# Patient Record
Sex: Female | Born: 1950 | Race: White | Hispanic: No | Marital: Married | Smoking: Never smoker
Health system: Southern US, Community
[De-identification: ages and names within clinical notes are randomized; demographics above are authoritative.]

## PROBLEM LIST (undated history)

## (undated) DIAGNOSIS — R51 Headache: Secondary | ICD-10-CM

## (undated) DIAGNOSIS — B958 Unspecified staphylococcus as the cause of diseases classified elsewhere: Secondary | ICD-10-CM

## (undated) DIAGNOSIS — D649 Anemia, unspecified: Secondary | ICD-10-CM

## (undated) DIAGNOSIS — Z5189 Encounter for other specified aftercare: Secondary | ICD-10-CM

## (undated) DIAGNOSIS — K219 Gastro-esophageal reflux disease without esophagitis: Secondary | ICD-10-CM

## (undated) DIAGNOSIS — R519 Headache, unspecified: Secondary | ICD-10-CM

## (undated) DIAGNOSIS — C50919 Malignant neoplasm of unspecified site of unspecified female breast: Secondary | ICD-10-CM

## (undated) HISTORY — PX: BREAST LUMPECTOMY WITH AXILLARY LYMPH NODE BIOPSY: SHX5593

## (undated) HISTORY — DX: Encounter for other specified aftercare: Z51.89

## (undated) HISTORY — DX: Malignant neoplasm of unspecified site of unspecified female breast: C50.919

---

## 1975-02-25 DIAGNOSIS — Z5189 Encounter for other specified aftercare: Secondary | ICD-10-CM

## 1975-02-25 HISTORY — DX: Encounter for other specified aftercare: Z51.89

## 1984-02-25 DIAGNOSIS — B958 Unspecified staphylococcus as the cause of diseases classified elsewhere: Secondary | ICD-10-CM

## 1984-02-25 HISTORY — DX: Unspecified staphylococcus as the cause of diseases classified elsewhere: B95.8

## 2007-04-07 ENCOUNTER — Encounter: Payer: Self-pay | Admitting: Family Medicine

## 2007-04-07 ENCOUNTER — Ambulatory Visit: Payer: Self-pay | Admitting: Family Medicine

## 2007-04-12 ENCOUNTER — Encounter: Admission: RE | Admit: 2007-04-12 | Discharge: 2007-04-12 | Payer: Self-pay | Admitting: Family Medicine

## 2007-04-13 ENCOUNTER — Ambulatory Visit: Payer: Self-pay | Admitting: Family Medicine

## 2007-04-20 ENCOUNTER — Encounter: Admission: RE | Admit: 2007-04-20 | Discharge: 2007-04-20 | Payer: Self-pay | Admitting: Family Medicine

## 2009-11-06 ENCOUNTER — Ambulatory Visit: Payer: Self-pay | Admitting: Obstetrics & Gynecology

## 2009-11-06 LAB — CONVERTED CEMR LAB
ALT: 16 units/L (ref 0–35)
AST: 18 units/L (ref 0–37)
BUN: 13 mg/dL (ref 6–23)
Calcium: 9.7 mg/dL (ref 8.4–10.5)
Chloride: 102 meq/L (ref 96–112)
Creatinine, Ser: 0.71 mg/dL (ref 0.40–1.20)
HDL: 53 mg/dL (ref >39)
TSH: 1.992 microintl units/mL (ref 0.350–4.500)
Total Bilirubin: 0.9 mg/dL (ref 0.3–1.2)
Total CHOL/HDL Ratio: 4.3
VLDL: 20 mg/dL (ref 0–40)

## 2009-12-12 ENCOUNTER — Ambulatory Visit (HOSPITAL_COMMUNITY): Admission: RE | Admit: 2009-12-12 | Discharge: 2009-12-12 | Payer: Self-pay | Admitting: Family Medicine

## 2010-04-03 ENCOUNTER — Encounter: Payer: Self-pay | Admitting: Family Medicine

## 2010-04-03 ENCOUNTER — Ambulatory Visit (INDEPENDENT_AMBULATORY_CARE_PROVIDER_SITE_OTHER): Payer: BC Managed Care – PPO | Admitting: Family Medicine

## 2010-04-03 DIAGNOSIS — R1013 Epigastric pain: Secondary | ICD-10-CM

## 2010-04-03 DIAGNOSIS — G43009 Migraine without aura, not intractable, without status migrainosus: Secondary | ICD-10-CM | POA: Insufficient documentation

## 2010-04-11 ENCOUNTER — Other Ambulatory Visit: Payer: Self-pay | Admitting: Family Medicine

## 2010-04-11 ENCOUNTER — Other Ambulatory Visit: Payer: BC Managed Care – PPO

## 2010-04-11 ENCOUNTER — Encounter (INDEPENDENT_AMBULATORY_CARE_PROVIDER_SITE_OTHER): Payer: Self-pay | Admitting: *Deleted

## 2010-04-11 DIAGNOSIS — Z1211 Encounter for screening for malignant neoplasm of colon: Secondary | ICD-10-CM

## 2010-04-11 NOTE — Letter (Signed)
Summary: [facility]: Immunoassay Fecal Occult Blood (iFOB) Order Form  [place] [facility]  [address] [place], [place] [address]   Phone: [phone]  Fax: [phone]      [facility]: Immunoassay Fecal Occult Blood (iFOB) Order Form   April 03, 2010 MRN: [id number]   [facility] Nov 13, 1950   Physicican Name:_________________________  Diagnosis Code:__________V76.51________________      Eustaquio Boyden  MD

## 2010-04-11 NOTE — Assessment & Plan Note (Addendum)
Summary: new pt to est DLO   Vital Signs:  Patient profile:   60 year old female Height:      66 inches Weight:      189.25 pounds BMI:     30.66 Temp:     98.2 degrees F oral Pulse rate:   76 / minute Pulse rhythm:   regular BP sitting:   124 / 80  (left arm) Cuff size:   large  Vitals Entered By: Selena Batten Dance CMA Duncan Dull) (April 03, 2010 9:40 AM) CC: New patient to establish care   History of Present Illness: CC: pew patient, establish  recently moved from [place], [place].    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  calcium -takes total of 1500mg  calcium.  MVI has 500IU vit D.  abd pain - mid epigastric, dull, sometimes notices more after drinking something.  Not really affected by food.  No nausea, fevers.  started when had very stressful job, changed job and got better but never fully went away.  wondered about ulcer.  no indigestion or acid reflux, no dysphagia.  not exhertional.  no weight changes.  no fevers/chills, NS.  preventative: no flu shot, doesn't usually take. unsure tetanus shot. never complete physical. well woman with Dr. Marice Potter, last pap/mammo 10/2009 at [facility] colonsocopy 2006 WNL, but does have strong fm hx  dexa scan done, told WNL  we should be getting records from colonscopy as well as Dexa scan, otherwise received care at [facility]  Current Medications (verified): 1)  Centrum Silver  Tabs (Multiple Vitamins-Minerals) .... One Daily 2)  Calcium-Magnesium-Zinc 1000-400-15 Mg Tabs .Marland Kitchen.. 1 By Mouth Two Times A Day 3)  Ester-C  Tabs (Bioflavonoid Products) .Marland Kitchen.. 1 By Mouth Once Daily  Allergies (verified): 1)  ! * Pseudophedrine  Past History:  Past Medical History: h/o chicken pox h/o migraines h/o blood transfusion 1977  OBGYN - Dr Marice Potter  Past Surgical History: C-section 1977, 1980  Family History: Mother - Colon CA (dec 84) sister - melanoma MGF - Colon CA (dec 66) MGM - heart issues, DM (24s) cousin -  CAD/MI (61yo M)  No other CA, CVA  Social History: No smoking, no EtOH, no rec drugs caffeine: 1 cup tea/day, 2 sodas/day Lives with husband, 1 bird Occupation: Currently [employer] part time, previously Advice worker in education  Review of Systems       The patient complains of abdominal pain.  The patient denies anorexia, fever, weight loss, weight gain, vision loss, decreased hearing, hoarseness, chest pain, syncope, dyspnea on exertion, peripheral edema, prolonged cough, headaches, hemoptysis, melena, hematochezia, severe indigestion/heartburn, hematuria, depression, and breast masses.         no BM changes, n/v/d.  Physical Exam  General:  Well-developed,well-nourished,in no acute distress; alert,appropriate and cooperative throughout examination Head:  Normocephalic and atraumatic without obvious abnormalities. No apparent alopecia or balding. Eyes:  No corneal or conjunctival inflammation noted. EOMI. Perrla.T Ears:  TMs clear bilaterally Nose:  nares clear bilaterally Mouth:  MMM, no pharyngeal erythema, edema, exudates Neck:  No deformities, masses, or tenderness  no LAD Lungs:  Normal respiratory effort, chest expands symmetrically. Lungs are clear to auscultation, no crackles or wheezes. Heart:  Normal rate and regular rhythm. S1 and S2 normal without gallop, murmur, click, rub or other extra sounds. Abdomen:  Bowel sounds positive,abdomen soft and non-tender without masses, organomegaly or hernias noted. Msk:  No deformity or scoliosis noted of thoracic or lumbar  spine.   Pulses:  2+ rad pulses, brisk cap refill Extremities:  no pedal edema Neurologic:  CN grossly intact, station and gait intact   Impression & Recommendations:  Problem # 1:  ABDOMINAL PAIN, EPIGASTRIC (ICD-789.06) Assessment New possible dyspepsia.  trial of PPI.  return in 1 month for f/u.  Problem # 2:  Preventive Health Care (ICD-V70.0) return for CPE.  pt unsure about  tetanus.  strong family history of colon cancer.  had normal colonoscopy per report 2006.  discussed if normal colonscopy, reassuring for 10 years.  given fmhx, will rescreen with iFOB.  await colonoscopy report that pt has requested be sent to [place].  Complete Medication List: 1)  Centrum Silver Tabs (Multiple vitamins-minerals) .... One daily 2)  Calcium-magnesium-zinc 1000-400-15 Mg Tabs  .Marland KitchenMarland Kitchen. 1 by mouth two times a day 3)  Ester-c Tabs (Bioflavonoid products) .Marland Kitchen.. 1 by mouth once daily 4)  Prilosec 20 Mg Cpdr (Omeprazole) .... Take one daily for 2-3 wks  Patient Instructions: 1)  Pleasure to meet you today. 2)  trial of omeprazole 20mg  daily for 2-3 wks. 3)  Return in 1 month for follow up of abdominal discomfort. 4)  If not better, please let me know return to be seen. 5)  I will check to see what blood work has been done recently.  May call you for follow up labs.   Orders Added: 1)  New Patient Level II [99202]    Current Allergies (reviewed today): ! * PSEUDOPHEDRINE  Appended Document: new pt to est DLO previous pcp:  Molly Maduro, [facility] [address] [place] [address]. previous colonosocpy: [facility], [place] [phone])

## 2010-04-12 ENCOUNTER — Encounter (INDEPENDENT_AMBULATORY_CARE_PROVIDER_SITE_OTHER): Payer: Self-pay | Admitting: *Deleted

## 2010-04-17 NOTE — Miscellaneous (Signed)
Summary: iFOB to flowsheet  Clinical Lists Changes  Observations: Added new observation of HEMOCULTDUE: 04/2011 (04/12/2010 8:30) Added new observation of HEMOCCULT: negative (04/11/2010 8:31)      Preventive Care Screening  Hemoccult:    Date:  04/11/2010    Next Due:  04/2011    Results:  negative

## 2010-04-17 NOTE — Letter (Signed)
Summary: Results Follow up Letter  [place] at [facility]  [address] [place], [place] [address]   Phone: [phone]  Fax: [phone]    04/12/2010 MRN: [id number]  [facility] [address] [place], [place]  [address]  [place]  Dear [name],  The following are the results of your recent test(s):  Test         Result    Pap Smear:        Normal _____  Not Normal _____ Comments: ______________________________________________________ Cholesterol: LDL(Bad cholesterol):         Your goal is less than:         HDL (Good cholesterol):       Your goal is more than: Comments:  ______________________________________________________ Mammogram:        Normal _____  Not Normal _____ Comments:  ___________________________________________________________________ Hemoccult:        Normal __X___  Not normal _______ Comments:  Repeat in 1 year  _____________________________________________________________________ Other Tests:    We routinely do not discuss normal results over the telephone.  If you desire a copy of the results, or you have any questions about this information we can discuss them at your next office visit.   Sincerely,       Dr. Eustaquio Boyden

## 2010-05-01 ENCOUNTER — Encounter: Payer: Self-pay | Admitting: Family Medicine

## 2010-05-01 ENCOUNTER — Ambulatory Visit (INDEPENDENT_AMBULATORY_CARE_PROVIDER_SITE_OTHER): Payer: BC Managed Care – PPO | Admitting: Family Medicine

## 2010-05-01 DIAGNOSIS — R1013 Epigastric pain: Secondary | ICD-10-CM

## 2010-05-01 DIAGNOSIS — Z9189 Other specified personal risk factors, not elsewhere classified: Secondary | ICD-10-CM | POA: Insufficient documentation

## 2010-05-07 NOTE — Assessment & Plan Note (Signed)
Summary: FOLLOW UP IN 1 MTH   Vital Signs:  Patient profile:   60 year old female Weight:      191.25 pounds Temp:     98.1 degrees F oral Pulse rate:   84 / minute Pulse rhythm:   regular BP sitting:   122 / 78  (left arm) Cuff size:   large  Vitals Entered By: Selena Batten Dance CMA (AAMA) (May 01, 2010 10:29 AM) CC: 1 month follow up   History of Present Illness: CC: f/u  85mo f/u for abd pain.  seen last month with epigastric discomfort stress induced, thought dyspepsia, treated with 2 wk course of ppi.  first 5 days on omeprazole felt nausea and sleepiness from med (looked up side effects).  still took 2 wk course, no longer feeling symptoms.    No further complaints today.  Allergies: 1)  ! * Pseudophedrine  Past History:  Past Medical History: Last updated: 04/03/2010 h/o chicken pox h/o migraines h/o blood transfusion 1977  OBGYN - Dr Marice Potter  Review of Systems       per HPI  Physical Exam  General:  Well-developed,well-nourished,in no acute distress; alert,appropriate and cooperative throughout examination Abdomen:  Bowel sounds positive,abdomen soft and non-tender without masses, organomegaly or hernias noted. Pulses:  2+ rad pulses, brisk cap refill Extremities:  no pedal edema   Impression & Recommendations:  Problem # 1:  ABDOMINAL PAIN, EPIGASTRIC (ICD-789.06) resolved after 2wk course PPI.  discussed likely dyspepsia, could try tums or zantac in future if returns.  if not resolving, return for eval.  Complete Medication List: 1)  Centrum Silver Tabs (Multiple vitamins-minerals) .... One daily 2)  Calcium-magnesium-zinc 1000-400-15 Mg Tabs  .Marland KitchenMarland Kitchen. 1 by mouth two times a day 3)  Ester-c Tabs (Bioflavonoid products) .Marland Kitchen.. 1 by mouth once daily  Patient Instructions: 1)  I'm glad you're feeling better. 2)  if having symptoms again, try tums or zantac.  if not improved let me know. 3)  call us with questions.  good to see you today.   Orders Added: 1)   Est. Patient Level II [82956]    Current Allergies (reviewed today): ! * PSEUDOPHEDRINE

## 2010-07-09 NOTE — Assessment & Plan Note (Signed)
NAME:  Jenna Pitts, Jenna Pitts               ACCOUNT NO.:  [id number]   MEDICAL RECORD NO.:  [id number]          PATIENT TYPE:  POB   LOCATION:  [facility]         FACILITY:  [facility]   PHYSICIAN:  Tinnie Gens, MD        DATE OF BIRTH:  08/09/1960   DATE OF SERVICE:  04/07/2007                                  CLINIC NOTE   CHIEF COMPLAINT:  Yearly exam.   HISTORY OF PRESENT ILLNESS:  The patient is a 60 year old gravida 2,  para 2 who presents today as a new patient for yearly exam.  The patient  has been menopausal for the past 7 years, no cycles, no abnormal  bleeding, no abnormal discharge.  Her last Pap smear was in 2007 and was  normal.  Last mammogram was February 2008, also normal.  She has a  colonoscopy which was normal March 2007.   PAST MEDICAL HISTORY:  She had pneumonia as a child.  Otherwise, no  medical problems.   SURGICAL HISTORY:  C-section x2.   MEDICATIONS:  1. Multivitamin 1 p.o. daily.  2. Calcium and magnesium and zinc combo 1 p.o. daily.  3. Estra-C 1 p.o. daily.  4. Fish oil 1 p.o. daily.   ALLERGIES:  None known.   OBSTETRICAL HISTORY:  G2 P2, 2 C-sections.   GYN HISTORY:  Menarche at age 44, menopause age 71, currently on no  birth control.   FAMILY HISTORY:  Coronary artery disease in her grandmother and then  colon cancer in her mother and grandfather.   SOCIAL HISTORY:  She is a retired Runner, broadcasting/film/video.  She currently works part-  time at [employer].  She is married.  She has recently moved here from  [place] because her daughter is here.  Her daughter is a Runner, broadcasting/film/video at  [employer], and her son-in-law has Ewing sarcoma and is  undergoing 1 year long chemotherapy for this, and she has a lot of  responsibility in child care for her granddaughter.  There is no  tobacco, alcohol, or drug use.   A 14-point review of systems was reviewed.  See gyn history in the chart  which is negative.   EXAM:  Pulse of 83, blood pressure 130/83, weight 191,  height 5 feet 6.5  inches.  GENERAL:  She is a well-developed, well-nourished female in no acute  distress.  HEENT:  Normocephalic, atraumatic.  Sclerae anicteric.  NECK:  Supple.  Normal thyroid.  LUNGS:  Clear bilaterally.  CV:  Regular rate and rhythm.  No rubs, gallops, or murmurs.  ABDOMEN:  Soft, nontender, nondistended.  No masses.  EXTREMITIES:  No cyanosis, clubbing or edema.  2+ distal pulses.  BREASTS:  Symmetric with everted nipples.  No masses.  No  supraclavicular or axillary adenopathy.  GU:  Normal external female genitalia.  External hemorrhoids are noticed.  BUS was normal.  Vagina is pink.  There is some loss of rugation. The cervix is nulliparous without  lesion.  Uterus is small, anteverted.  No adnexal mass or tenderness   IMPRESSION:  Yearly exam.   PLAN:  1. Pap smear today.  2. Order mammogram.  3. She will need  followup Pap smear in 2-3 years if this one is      normal.  4. Needs yearly mammograms.  5. Check lipid panel, TSH, CBC, and BMP for supportive for yearly      exam.           ______________________________  Tinnie Gens, MD     TP/MEDQ  D:  04/07/2007  T:  04/08/2007  Job:  [id number]

## 2010-07-09 NOTE — Assessment & Plan Note (Signed)
NAMEVOLLIE, AARON               ACCOUNT NO.:  [id number]   MEDICAL RECORD NO.:  [id number]          PATIENT TYPE:  POB   LOCATION:  [facility]         FACILITY:  [facility]   PHYSICIAN:  Allie Bossier, MD        DATE OF BIRTH:  [birth year]   DATE OF SERVICE:  11/06/2009                                  CLINIC NOTE   Ms. Stults is a 60 year old married white gravida 2, para 2 who comes  in here for a new exam.  She has no particular complaints.   PAST MEDICAL HISTORY:  Obesity.   PAST SURGICAL HISTORY:  C-section x2 and a tubal ligation.   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  She has been married for 37 years and rarely has sex secondary  to her husband's ED.  She gives a distant history of a blood transfusion  with her first cesarean section.  Her last Pap smear and mammogram were  both done in 2009 and her last colonoscopy was done in 2007.   Family history is positive for colon cancer in her mother, maternal  grandfather, and maternal uncle.  She has no family history of breast or  GYN cancer.  There is diabetes and coronary artery disease in her  family.  No latex allergies.  No drug allergies.   Social history is negative for tobacco, alcohol or drug use.   MEDICATIONS:  Multivitamin, calcium, magnesium, zinc, Estar and fish oil  daily.   PHYSICAL EXAMINATION:  GENERAL:  Well-nourished, well-hydrated pleasant  white female.  VITAL SIGNS:  Height 5 feet 6-1/2 inches, weight 196 pounds, blood  pressure 134/92, pulse 74.  HEENT:  Normal.  HEART:  Regular rate and rhythm.  LUNGS:  Clear to auscultation bilaterally.  ABDOMEN:  Obese, benign.  PELVIC:  External genitalia normal.  No lesions.  She has some  varicosities on her mons.  Cervix nulliparous.  There is a mild to  moderate amount of vaginal atrophy.  Uterus normal size and shape,  anteverted, mobile.  Adnexa nontender.  No masses.   ASSESSMENT AND PLAN:  Annual  exam.  I have checked Pap smear.  Recommended self-breast and self-vulvar exams.  I scheduled a mammogram.  I am referring her to a gastroenterologist for repeat colonoscopy due to  her family history.  Recommended that we go ahead and check fasting  lipids, TSH and sugar today.      Allie Bossier, MD     MCD/MEDQ  D:  11/06/2009  T:  11/07/2009  Job:  [id number]

## 2012-02-13 ENCOUNTER — Encounter: Payer: Self-pay | Admitting: Obstetrics and Gynecology

## 2012-02-13 ENCOUNTER — Ambulatory Visit (INDEPENDENT_AMBULATORY_CARE_PROVIDER_SITE_OTHER): Payer: BC Managed Care – PPO | Admitting: Obstetrics and Gynecology

## 2012-02-13 VITALS — BP 134/85 | HR 66 | Ht 66.0 in | Wt 191.0 lb

## 2012-02-13 DIAGNOSIS — Z124 Encounter for screening for malignant neoplasm of cervix: Secondary | ICD-10-CM

## 2012-02-13 DIAGNOSIS — Z01419 Encounter for gynecological examination (general) (routine) without abnormal findings: Secondary | ICD-10-CM

## 2012-02-13 NOTE — Patient Instructions (Signed)
Preventive Care for Adults, Female A healthy lifestyle and preventive care can promote health and wellness. Preventive health guidelines for women include the following key practices.  A routine yearly physical is a good way to check with your caregiver about your health and preventive screening. It is a chance to share any concerns and updates on your health, and to receive a thorough exam.  Visit your dentist for a routine exam and preventive care every 6 months. Brush your teeth twice a day and floss once a day. Good oral hygiene prevents tooth decay and gum disease.  The frequency of eye exams is based on your age, health, family medical history, use of contact lenses, and other factors. Follow your caregiver's recommendations for frequency of eye exams.  Eat a healthy diet. Foods like vegetables, fruits, whole grains, low-fat dairy products, and lean protein foods contain the nutrients you need without too many calories. Decrease your intake of foods high in solid fats, added sugars, and salt. Eat the right amount of calories for you.Get information about a proper diet from your caregiver, if necessary.  Regular physical exercise is one of the most important things you can do for your health. Most adults should get at least 150 minutes of moderate-intensity exercise (any activity that increases your heart rate and causes you to sweat) each week. In addition, most adults need muscle-strengthening exercises on 2 or more days a week.  Maintain a healthy weight. The body mass index (BMI) is a screening tool to identify possible weight problems. It provides an estimate of body fat based on height and weight. Your caregiver can help determine your BMI, and can help you achieve or maintain a healthy weight.For adults 20 years and older:  A BMI below 18.5 is considered underweight.  A BMI of 18.5 to 24.9 is normal.  A BMI of 25 to 29.9 is considered overweight.  A BMI of 30 and above is  considered obese.  Maintain normal blood lipids and cholesterol levels by exercising and minimizing your intake of saturated fat. Eat a balanced diet with plenty of fruit and vegetables. Blood tests for lipids and cholesterol should begin at age 20 and be repeated every 5 years. If your lipid or cholesterol levels are high, you are over 50, or you are at high risk for heart disease, you may need your cholesterol levels checked more frequently.Ongoing high lipid and cholesterol levels should be treated with medicines if diet and exercise are not effective.  If you smoke, find out from your caregiver how to quit. If you do not use tobacco, do not start.  If you are pregnant, do not drink alcohol. If you are breastfeeding, be very cautious about drinking alcohol. If you are not pregnant and choose to drink alcohol, do not exceed 1 drink per day. One drink is considered to be 12 ounces (355 mL) of beer, 5 ounces (148 mL) of wine, or 1.5 ounces (44 mL) of liquor.  Avoid use of street drugs. Do not share needles with anyone. Ask for help if you need support or instructions about stopping the use of drugs.  High blood pressure causes heart disease and increases the risk of stroke. Your blood pressure should be checked at least every 1 to 2 years. Ongoing high blood pressure should be treated with medicines if weight loss and exercise are not effective.  If you are 55 to 60 years old, ask your caregiver if you should take aspirin to prevent strokes.  Diabetes   screening involves taking a blood sample to check your fasting blood sugar level. This should be done once every 3 years, after age 45, if you are within normal weight and without risk factors for diabetes. Testing should be considered at a younger age or be carried out more frequently if you are overweight and have at least 1 risk factor for diabetes.  Breast cancer screening is essential preventive care for women. You should practice "breast  self-awareness." This means understanding the normal appearance and feel of your breasts and may include breast self-examination. Any changes detected, no matter how small, should be reported to a caregiver. Women in their 20s and 30s should have a clinical breast exam (CBE) by a caregiver as part of a regular health exam every 1 to 3 years. After age 40, women should have a CBE every year. Starting at age 40, women should consider having a mammography (breast X-ray test) every year. Women who have a family history of breast cancer should talk to their caregiver about genetic screening. Women at a high risk of breast cancer should talk to their caregivers about having magnetic resonance imaging (MRI) and a mammography every year.  The Pap test is a screening test for cervical cancer. A Pap test can show cell changes on the cervix that might become cervical cancer if left untreated. A Pap test is a procedure in which cells are obtained and examined from the lower end of the uterus (cervix).  Women should have a Pap test starting at age 21.  Between ages 21 and 29, Pap tests should be repeated every 2 years.  Beginning at age 30, you should have a Pap test every 3 years as long as the past 3 Pap tests have been normal.  Some women have medical problems that increase the chance of getting cervical cancer. Talk to your caregiver about these problems. It is especially important to talk to your caregiver if a new problem develops soon after your last Pap test. In these cases, your caregiver may recommend more frequent screening and Pap tests.  The above recommendations are the same for women who have or have not gotten the vaccine for human papillomavirus (HPV).  If you had a hysterectomy for a problem that was not cancer or a condition that could lead to cancer, then you no longer need Pap tests. Even if you no longer need a Pap test, a regular exam is a good idea to make sure no other problems are  starting.  If you are between ages 65 and 70, and you have had normal Pap tests going back 10 years, you no longer need Pap tests. Even if you no longer need a Pap test, a regular exam is a good idea to make sure no other problems are starting.  If you have had past treatment for cervical cancer or a condition that could lead to cancer, you need Pap tests and screening for cancer for at least 20 years after your treatment.  If Pap tests have been discontinued, risk factors (such as a new sexual partner) need to be reassessed to determine if screening should be resumed.  The HPV test is an additional test that may be used for cervical cancer screening. The HPV test looks for the virus that can cause the cell changes on the cervix. The cells collected during the Pap test can be tested for HPV. The HPV test could be used to screen women aged 30 years and older, and should   be used in women of any age who have unclear Pap test results. After the age of 30, women should have HPV testing at the same frequency as a Pap test.  Colorectal cancer can be detected and often prevented. Most routine colorectal cancer screening begins at the age of 50 and continues through age 75. However, your caregiver may recommend screening at an earlier age if you have risk factors for colon cancer. On a yearly basis, your caregiver may provide home test kits to check for hidden blood in the stool. Use of a small camera at the end of a tube, to directly examine the colon (sigmoidoscopy or colonoscopy), can detect the earliest forms of colorectal cancer. Talk to your caregiver about this at age 50, when routine screening begins. Direct examination of the colon should be repeated every 5 to 10 years through age 75, unless early forms of pre-cancerous polyps or small growths are found.  Hepatitis C blood testing is recommended for all people born from 1945 through 1965 and any individual with known risks for hepatitis C.  Practice  safe sex. Use condoms and avoid high-risk sexual practices to reduce the spread of sexually transmitted infections (STIs). STIs include gonorrhea, chlamydia, syphilis, trichomonas, herpes, HPV, and human immunodeficiency virus (HIV). Herpes, HIV, and HPV are viral illnesses that have no cure. They can result in disability, cancer, and death. Sexually active women aged 25 and younger should be checked for chlamydia. Older women with new or multiple partners should also be tested for chlamydia. Testing for other STIs is recommended if you are sexually active and at increased risk.  Osteoporosis is a disease in which the bones lose minerals and strength with aging. This can result in serious bone fractures. The risk of osteoporosis can be identified using a bone density scan. Women ages 65 and over and women at risk for fractures or osteoporosis should discuss screening with their caregivers. Ask your caregiver whether you should take a calcium supplement or vitamin D to reduce the rate of osteoporosis.  Menopause can be associated with physical symptoms and risks. Hormone replacement therapy is available to decrease symptoms and risks. You should talk to your caregiver about whether hormone replacement therapy is right for you.  Use sunscreen with sun protection factor (SPF) of 30 or more. Apply sunscreen liberally and repeatedly throughout the day. You should seek shade when your shadow is shorter than you. Protect yourself by wearing long sleeves, pants, a wide-brimmed hat, and sunglasses year round, whenever you are outdoors.  Once a month, do a whole body skin exam, using a mirror to look at the skin on your back. Notify your caregiver of new moles, moles that have irregular borders, moles that are larger than a pencil eraser, or moles that have changed in shape or color.  Stay current with required immunizations.  Influenza. You need a dose every fall (or winter). The composition of the flu vaccine  changes each year, so being vaccinated once is not enough.  Pneumococcal polysaccharide. You need 1 to 2 doses if you smoke cigarettes or if you have certain chronic medical conditions. You need 1 dose at age 65 (or older) if you have never been vaccinated.  Tetanus, diphtheria, pertussis (Tdap, Td). Get 1 dose of Tdap vaccine if you are younger than age 65, are over 65 and have contact with an infant, are a healthcare worker, are pregnant, or simply want to be protected from whooping cough. After that, you need a Td   booster dose every 10 years. Consult your caregiver if you have not had at least 3 tetanus and diphtheria-containing shots sometime in your life or have a deep or dirty wound.  HPV. You need this vaccine if you are a woman age 26 or younger. The vaccine is given in 3 doses over 6 months.  Measles, mumps, rubella (MMR). You need at least 1 dose of MMR if you were born in 1957 or later. You may also need a second dose.  Meningococcal. If you are age 19 to 21 and a first-year college student living in a residence hall, or have one of several medical conditions, you need to get vaccinated against meningococcal disease. You may also need additional booster doses.  Zoster (shingles). If you are age 60 or older, you should get this vaccine.  Varicella (chickenpox). If you have never had chickenpox or you were vaccinated but received only 1 dose, talk to your caregiver to find out if you need this vaccine.  Hepatitis A. You need this vaccine if you have a specific risk factor for hepatitis A virus infection or you simply wish to be protected from this disease. The vaccine is usually given as 2 doses, 6 to 18 months apart.  Hepatitis B. You need this vaccine if you have a specific risk factor for hepatitis B virus infection or you simply wish to be protected from this disease. The vaccine is given in 3 doses, usually over 6 months. Preventive Services / Frequency Ages 40 to 64  Blood  pressure check.** / Every 1 to 2 years.  Lipid and cholesterol check.** / Every 5 years beginning at age 20.  Clinical breast exam.** / Every year after age 40.  Mammogram.** / Every year beginning at age 40 and continuing for as long as you are in good health. Consult with your caregiver.  Pap test.** / Every 3 years starting at age 30 through age 65 or 70 with a history of 3 consecutive normal Pap tests.  HPV screening.** / Every 3 years from ages 30 through ages 65 to 70 with a history of 3 consecutive normal Pap tests.  Fecal occult blood test (FOBT) of stool. / Every year beginning at age 50 and continuing until age 75. You may not need to do this test if you get a colonoscopy every 10 years.  Flexible sigmoidoscopy or colonoscopy.** / Every 5 years for a flexible sigmoidoscopy or every 10 years for a colonoscopy beginning at age 50 and continuing until age 75.  Hepatitis C blood test.** / For all people born from 1945 through 1965 and any individual with known risks for hepatitis C.  Skin self-exam. / Monthly.  Influenza immunization.** / Every year.  Pneumococcal polysaccharide immunization.** / 1 to 2 doses if you smoke cigarettes or if you have certain chronic medical conditions.  Tetanus, diphtheria, pertussis (Tdap, Td) immunization.** / A one-time dose of Tdap vaccine. After that, you need a Td booster dose every 10 years.  Measles, mumps, rubella (MMR) immunization. / You need at least 1 dose of MMR if you were born in 1957 or later. You may also need a second dose.  Varicella immunization.** / Consult your caregiver.  Meningococcal immunization.** / Consult your caregiver.  Hepatitis A immunization.** / Consult your caregiver. 2 doses, 6 to 18 months apart.  Hepatitis B immunization.** / Consult your caregiver. 3 doses, usually over 6 months. ** Family history and personal history of risk and conditions may change your caregiver's recommendations.   Document Released:  04/08/2001 Document Revised: 05/05/2011 Document Reviewed: 07/08/2010 [employer] Patient Information 2013 [employer].  

## 2012-02-13 NOTE — Progress Notes (Signed)
  Subjective:     Jenna Pitts is a 61 y.o. female postmenopausal who is here for a comprehensive physical exam. The patient reports no problems. No history of incontinence. Sexually active without any concerns  History   Social History  . Marital Status: Married    Spouse Name: N/A    Number of Children: N/A  . Years of Education: N/A   Occupational History  . Not on file.   Social History Main Topics  . Smoking status: Never Smoker   . Smokeless tobacco: Not on file  . Alcohol Use: No  . Drug Use: No  . Sexually Active: Yes -- Female partner(s)    Birth Control/ Protection: Post-menopausal   Other Topics Concern  . Not on file   Social History Narrative  . No narrative on file   Health Maintenance  Topic Date Due  . Pap Smear  08/09/1969  . Tetanus/tdap  08/10/1970  . Colonoscopy  08/09/2001  . Zostavax  08/10/2011  . Influenza Vaccine  10/26/2011  . Mammogram  12/13/2011       Review of Systems A comprehensive review of systems was negative.   Objective:     GENERAL: Well-developed, well-nourished female in no acute distress.  HEENT: Normocephalic, atraumatic. Sclerae anicteric.  NECK: Supple. Normal thyroid.  LUNGS: Clear to auscultation bilaterally.  HEART: Regular rate and rhythm. BREASTS: Symmetric in size. No palpable masses or lymphadenopathy, skin changes, or nipple drainage. ABDOMEN: Soft, nontender, nondistended. No organomegaly. PELVIC: Normal external female genitalia. Vagina is pink and rugated.  Normal discharge. Normal appearing cervix. Uterus is normal in size.  No adnexal mass or tenderness. EXTREMITIES: No cyanosis, clubbing, or edema, 2+ distal pulses.    Assessment:    Healthy female exam.      Plan:    Pap smear collected Referral for mammogram provided Patient advised to continue monthly self breast and vulva exam Continue multivitamins See After Visit Summary for Counseling Recommendations

## 2012-03-01 ENCOUNTER — Encounter: Payer: Self-pay | Admitting: Obstetrics and Gynecology

## 2013-12-26 ENCOUNTER — Encounter: Payer: Self-pay | Admitting: Obstetrics and Gynecology

## 2014-01-12 ENCOUNTER — Other Ambulatory Visit: Payer: Self-pay | Admitting: Obstetrics & Gynecology

## 2014-01-12 ENCOUNTER — Encounter: Payer: Self-pay | Admitting: Internal Medicine

## 2014-01-12 ENCOUNTER — Ambulatory Visit (INDEPENDENT_AMBULATORY_CARE_PROVIDER_SITE_OTHER): Payer: BC Managed Care – PPO | Admitting: Obstetrics & Gynecology

## 2014-01-12 ENCOUNTER — Encounter: Payer: Self-pay | Admitting: Obstetrics & Gynecology

## 2014-01-12 VITALS — BP 131/94 | HR 85 | Ht 66.0 in | Wt 171.0 lb

## 2014-01-12 DIAGNOSIS — Z23 Encounter for immunization: Secondary | ICD-10-CM

## 2014-01-12 DIAGNOSIS — Z1151 Encounter for screening for human papillomavirus (HPV): Secondary | ICD-10-CM

## 2014-01-12 DIAGNOSIS — Z01419 Encounter for gynecological examination (general) (routine) without abnormal findings: Secondary | ICD-10-CM

## 2014-01-12 DIAGNOSIS — Z124 Encounter for screening for malignant neoplasm of cervix: Secondary | ICD-10-CM

## 2014-01-12 DIAGNOSIS — Z1231 Encounter for screening mammogram for malignant neoplasm of breast: Secondary | ICD-10-CM

## 2014-01-12 DIAGNOSIS — Z0000 Encounter for general adult medical examination without abnormal findings: Secondary | ICD-10-CM

## 2014-01-12 LAB — TSH: TSH: 2.322 u[IU]/mL (ref 0.350–4.500)

## 2014-01-12 LAB — CBC
HEMATOCRIT: 42.4 % (ref 36.0–46.0)
Hemoglobin: 14.1 g/dL (ref 12.0–15.0)
MCH: 30.3 pg (ref 26.0–34.0)
MCHC: 33.3 g/dL (ref 30.0–36.0)
MCV: 91.2 fL (ref 78.0–100.0)
MPV: 10.3 fL (ref 9.4–12.4)
Platelets: 264 10*3/uL (ref 150–400)
RBC: 4.65 MIL/uL (ref 3.87–5.11)
RDW: 14.1 % (ref 11.5–15.5)
WBC: 5.2 10*3/uL (ref 4.0–10.5)

## 2014-01-12 NOTE — Progress Notes (Signed)
Subjective:    Jenna Pitts is a 63 y.o. MW (son 67 yo and daughter 52 and 5 grandkids) female who presents for an annual exam. The patient has no complaints today. The patient is not currently sexually active. GYN screening history: last pap: was normal. The patient wears seatbelts: yes. The patient participates in regular exercise: yes. (pilates) Has the patient ever been transfused or tattooed?: yes.(transfused with a cesarean) The patient reports that there is not domestic violence in her life.   Menstrual History: OB History    Gravida Para Term Preterm AB TAB SAB Ectopic Multiple Living   2         2      Menarche age: 38  No LMP recorded. Patient is postmenopausal.    The following portions of the patient's history were reviewed and updated as appropriate: allergies, current medications, past family history, past medical history, past social history, past surgical history and problem list.  Review of Systems Pertinent items are noted in HPI. Married for 41 years. Her husband has ED. She will get a flu vaccine today. 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 Last colonoscopy about 10 years ago.   Objective:    BP 131/94 mmHg  Pulse 85  Ht 5\' 6"  (1.676 m)  Wt 171 lb (77.565 kg)  BMI 27.61 kg/m2  General Appearance:    Alert, cooperative, no distress, appears stated age  Head:    Normocephalic, without obvious abnormality, atraumatic  Eyes:    PERRL, conjunctiva/corneas clear, EOM's intact, fundi    benign, both eyes  Ears:    Normal TM's and external ear canals, both ears  Nose:   Nares normal, septum midline, mucosa normal, no drainage    or sinus tenderness  Throat:   Lips, mucosa, and tongue normal; teeth and gums normal  Neck:   Supple, symmetrical, trachea midline, no adenopathy;    thyroid:  no enlargement/tenderness/nodules; no carotid   bruit or JVD  Back:     Symmetric, no curvature, ROM normal, no CVA tenderness  Lungs:     Clear to  auscultation bilaterally, respirations unlabored  Chest Wall:    No tenderness or deformity   Heart:    Regular rate and rhythm, S1 and S2 normal, no murmur, rub   or gallop  Breast Exam:    No tenderness, masses, or nipple abnormality  Abdomen:     Soft, non-tender, bowel sounds active all four quadrants,    no masses, no organomegaly  Genitalia:    Normal female without lesion, discharge or tenderness, NSSA, NT, normal adnexal exam     Extremities:   Extremities normal, atraumatic, no cyanosis or edema  Pulses:   2+ and symmetric all extremities  Skin:   Skin color, texture, turgor normal, no rashes or lesions  Lymph nodes:   Cervical, supraclavicular, and axillary nodes normal  Neurologic:   CNII-XII intact, normal strength, sensation and reflexes    throughout  .    Assessment:    Healthy female exam.    Plan:     Breast self exam technique reviewed and patient encouraged to perform self-exam monthly. Mammogram. Thin prep Pap smear. with cotesting Fasting labs today Refer to GI for colonoscopy Flu vaccine today

## 2014-01-13 LAB — COMPREHENSIVE METABOLIC PANEL
ALBUMIN: 4.2 g/dL (ref 3.5–5.2)
ALK PHOS: 85 U/L (ref 39–117)
ALT: 12 U/L (ref 0–35)
AST: 18 U/L (ref 0–37)
BUN: 8 mg/dL (ref 6–23)
CO2: 27 mEq/L (ref 19–32)
Calcium: 9.9 mg/dL (ref 8.4–10.5)
Chloride: 103 mEq/L (ref 96–112)
Creat: 0.68 mg/dL (ref 0.50–1.10)
GLUCOSE: 86 mg/dL (ref 70–99)
POTASSIUM: 4.6 meq/L (ref 3.5–5.3)
SODIUM: 141 meq/L (ref 135–145)
TOTAL PROTEIN: 7.3 g/dL (ref 6.0–8.3)
Total Bilirubin: 0.8 mg/dL (ref 0.2–1.2)

## 2014-01-13 LAB — LIPID PANEL
Cholesterol: 190 mg/dL (ref 0–200)
HDL: 50 mg/dL (ref >39)
LDL CALC: 116 mg/dL — AB (ref 0–99)
Total CHOL/HDL Ratio: 3.8 Ratio
Triglycerides: 121 mg/dL (ref <150)
VLDL: 24 mg/dL (ref 0–40)

## 2014-01-13 LAB — CYTOLOGY - PAP

## 2014-02-07 LAB — HM MAMMOGRAPHY: HM MAMMO: ABNORMAL

## 2014-02-09 ENCOUNTER — Telehealth: Payer: Self-pay | Admitting: *Deleted

## 2014-02-09 NOTE — Telephone Encounter (Signed)
Called patient to make sure she was set up for breast biopsy due to suspicious findings on her mammogram and ultrasound from [facility].  She is scheduled for December 30.

## 2014-03-01 DIAGNOSIS — C50919 Malignant neoplasm of unspecified site of unspecified female breast: Secondary | ICD-10-CM | POA: Insufficient documentation

## 2014-03-06 HISTORY — PX: BREAST SURGERY: SHX581

## 2014-03-10 ENCOUNTER — Encounter: Payer: BC Managed Care – PPO | Admitting: Internal Medicine

## 2014-04-05 ENCOUNTER — Ambulatory Visit: Payer: Self-pay | Admitting: Oncology

## 2014-04-05 ENCOUNTER — Ambulatory Visit: Payer: Self-pay | Admitting: Internal Medicine

## 2014-04-13 ENCOUNTER — Ambulatory Visit: Payer: Self-pay | Admitting: Vascular Surgery

## 2014-04-13 HISTORY — PX: PORTACATH PLACEMENT: SHX2246

## 2014-04-25 ENCOUNTER — Ambulatory Visit
Admit: 2014-04-25 | Disposition: A | Discharge status: Home / Self Care | Payer: Self-pay | Attending: Internal Medicine | Admitting: Internal Medicine

## 2014-05-22 LAB — BASIC METABOLIC PANEL
ANION GAP: 5 — AB (ref 7–16)
BUN: 12 mg/dL
CALCIUM: 9.2 mg/dL
CHLORIDE: 104 mmol/L
CO2: 27 mmol/L
Creatinine: 0.64 mg/dL
GLUCOSE: 114 mg/dL — AB
POTASSIUM: 4 mmol/L
SODIUM: 136 mmol/L

## 2014-05-22 LAB — CBC CANCER CENTER
BASOS ABS: 0 x10 3/mm (ref 0.0–0.1)
Basophil %: 0.4 %
EOS ABS: 0 x10 3/mm (ref 0.0–0.7)
EOS PCT: 0.3 %
HCT: 37.2 % (ref 35.0–47.0)
HGB: 12.5 g/dL (ref 12.0–16.0)
LYMPHS ABS: 1 x10 3/mm (ref 1.0–3.6)
Lymphocyte %: 11.1 %
MCH: 30.8 pg (ref 26.0–34.0)
MCHC: 33.6 g/dL (ref 32.0–36.0)
MCV: 92 fL (ref 80–100)
MONO ABS: 0.5 x10 3/mm (ref 0.2–0.9)
Monocyte %: 5.2 %
NEUTROS PCT: 83 %
Neutrophil #: 7.8 x10 3/mm — ABNORMAL HIGH (ref 1.4–6.5)
PLATELETS: 187 x10 3/mm (ref 150–440)
RBC: 4.06 10*6/uL (ref 3.80–5.20)
RDW: 15 % — AB (ref 11.5–14.5)
WBC: 9.4 x10 3/mm (ref 3.6–11.0)

## 2014-05-22 LAB — HEPATIC FUNCTION PANEL A (ARMC)
ALBUMIN: 4 g/dL
Alkaline Phosphatase: 114 U/L
BILIRUBIN TOTAL: 0.7 mg/dL
Bilirubin, Direct: <0.1 mg/dL
SGOT(AST): 22 U/L
SGPT (ALT): 21 U/L
TOTAL PROTEIN: 7.2 g/dL

## 2014-05-26 ENCOUNTER — Ambulatory Visit
Admit: 2014-05-26 | Disposition: A | Discharge status: Home / Self Care | Payer: Self-pay | Attending: Internal Medicine | Admitting: Internal Medicine

## 2014-05-29 LAB — CBC CANCER CENTER
BASOS ABS: 0 x10 3/mm (ref 0.0–0.1)
Basophil %: 0.1 %
EOS ABS: 0 x10 3/mm (ref 0.0–0.7)
Eosinophil %: 0 %
HCT: 35 % (ref 35.0–47.0)
HGB: 11.4 g/dL — ABNORMAL LOW (ref 12.0–16.0)
LYMPHS ABS: 0.6 x10 3/mm — AB (ref 1.0–3.6)
Lymphocyte %: 4.7 %
MCH: 30.3 pg (ref 26.0–34.0)
MCHC: 32.7 g/dL (ref 32.0–36.0)
MCV: 93 fL (ref 80–100)
MONO ABS: 0.6 x10 3/mm (ref 0.2–0.9)
Monocyte %: 4.9 %
NEUTROS ABS: 11.6 x10 3/mm — AB (ref 1.4–6.5)
Neutrophil %: 90.3 %
Platelet: 351 x10 3/mm (ref 150–440)
RBC: 3.77 10*6/uL — ABNORMAL LOW (ref 3.80–5.20)
RDW: 15.1 % — AB (ref 11.5–14.5)
WBC: 12.9 x10 3/mm — ABNORMAL HIGH (ref 3.6–11.0)

## 2014-06-16 LAB — BASIC METABOLIC PANEL
ANION GAP: 8 (ref 7–16)
BUN: 10 mg/dL
Calcium, Total: 9.3 mg/dL
Chloride: 104 mmol/L
Co2: 26 mmol/L
Creatinine: 0.6 mg/dL
EGFR (African American): >60
EGFR (Non-African Amer.): >60
Glucose: 120 mg/dL — ABNORMAL HIGH
POTASSIUM: 3.8 mmol/L
SODIUM: 138 mmol/L

## 2014-06-16 LAB — HEPATIC FUNCTION PANEL A (ARMC)
ALK PHOS: 84 U/L
ALT: 17 U/L
AST: 25 U/L
Albumin: 4 g/dL
BILIRUBIN DIRECT: 0.2 mg/dL
BILIRUBIN INDIRECT: 0.8
Bilirubin,Total: 1 mg/dL
Total Protein: 7.4 g/dL

## 2014-06-16 LAB — CBC CANCER CENTER
BASOS PCT: 2 %
Basophil #: 0.1 x10 3/mm (ref 0.0–0.1)
EOS ABS: 0.1 x10 3/mm (ref 0.0–0.7)
EOS PCT: 2.9 %
HCT: 37.5 % (ref 35.0–47.0)
HGB: 12.3 g/dL (ref 12.0–16.0)
LYMPHS PCT: 21.8 %
Lymphocyte #: 0.9 x10 3/mm — ABNORMAL LOW (ref 1.0–3.6)
MCH: 30.8 pg (ref 26.0–34.0)
MCHC: 32.9 g/dL (ref 32.0–36.0)
MCV: 94 fL (ref 80–100)
MONO ABS: 0.4 x10 3/mm (ref 0.2–0.9)
Monocyte %: 8.8 %
NEUTROS ABS: 2.6 x10 3/mm (ref 1.4–6.5)
Neutrophil %: 64.5 %
Platelet: 230 x10 3/mm (ref 150–440)
RBC: 4.01 10*6/uL (ref 3.80–5.20)
RDW: 15.1 % — AB (ref 11.5–14.5)
WBC: 4.1 x10 3/mm (ref 3.6–11.0)

## 2014-06-25 NOTE — Op Note (Signed)
PATIENT NAME:  Jenna Pitts, Jenna Pitts MR#:  [id number] DATE OF BIRTH:  March 24, 1950  DATE OF PROCEDURE:  04/13/2014  DATE OF BIRTH: 04/09/1950.   DATE OF PROCEDURE: 04/13/2014.   PREOPERATIVE DIAGNOSIS:  Breast cancer.  POSTOPERATIVE DIAGNOSIS:  Breast cancer.    PROCEDURES:  1.  Ultrasound guidance for vascular access, left internal jugular vein.  2.  Fluoroscopic guidance for placement of catheter.  3.  Placement of CT compatible Port-A-Cath, left internal jugular vein.   SURGEON:  Leotis Pain, MD  ANESTHESIA:  Local with moderate conscious sedation.   FLUOROSCOPY TIME:  Less than 1 minute.   CONTRAST:  Zero.   ESTIMATED BLOOD LOSS:  Minimal.   INDICATION FOR PROCEDURE:  The patient is a 64 year old female with breast cancer.  She recently had a lumpectomy on the right and was getting radiation on the right. She also needs chemotherapy and we are asked to place a port. Risks and benefits were discussed. Informed consent was obtained.   DESCRIPTION OF THE PROCEDURE:  The patient was brought to the vascular and interventional radiology suite. The left neck and chest were sterilely prepped and draped, and a sterile surgical field was created. Ultrasound was used to help visualize a patent left internal jugular vein. This was then accessed under direct ultrasound guidance without difficulty with a Seldinger needle and a permanent image was recorded. A J-wire was placed. After skin nick and dilatation, the peel-away sheath was then placed over the wire. I then anesthetized an area under the clavicle approximately 2 fingerbreadths. A transverse incision was created and an inferior pocket was created with electrocautery and blunt dissection. The port was then brought onto the field, placed into the pocket and secured to the chest wall with 2 Prolene sutures. The catheter was connected to the port and tunneled from the subclavicular incision to the access site. Fluoroscopic guidance was used to cut the  catheter to an appropriate length. The catheter was then placed through the peel-away sheath and the peel-away sheath was removed. The catheter tip was parked in excellent location in the cavoatrial junction. The pocket was then irrigated with antibiotic-impregnated saline and the wound was closed with a running 3-0 Vicryl and a 4-0 Monocryl. The access incision was closed with a single 4-0 Monocryl. The Huber needle was used to withdraw blood and flush the port with heparinized saline. Dermabond was then placed as a dressing. The patient tolerated the procedure well and was taken to the recovery room in stable condition.    ____________________________ Algernon Huxley, MD jsd:mc D: 04/13/2014 11:48:56 ET T: 04/13/2014 12:09:43 ET JOB#: [id number]  cc: Algernon Huxley, MD, <Dictator> Algernon Huxley MD ELECTRONICALLY SIGNED 04/19/2014 16:39

## 2014-06-26 ENCOUNTER — Telehealth: Payer: Self-pay | Admitting: *Deleted

## 2014-06-26 NOTE — Telephone Encounter (Signed)
Need tyo speak with someone ASAP regarding this pt

## 2014-06-26 NOTE — Telephone Encounter (Signed)
Tried paging Dr.Muss, no answer. Will wait for his call again.

## 2014-06-27 ENCOUNTER — Other Ambulatory Visit: Payer: Self-pay | Admitting: Internal Medicine

## 2014-06-27 MED ORDER — LETROZOLE 2.5 MG PO TABS: 2.5000 mg | ORAL_TABLET | Freq: Every day | ORAL | Status: DC

## 2014-06-29 ENCOUNTER — Telehealth: Payer: Self-pay | Admitting: *Deleted

## 2014-06-29 ENCOUNTER — Other Ambulatory Visit: Payer: Self-pay | Admitting: Oncology

## 2014-06-29 NOTE — Telephone Encounter (Signed)
Received request from Dr. Jana Hakim to schedule this pt to see him for a second opinion.  Called pt and she was seen by [facility] as well as [facility].  Informed her that I would contact them to obtain information.  She asked about insurance covering the visit for a second opinion and informed her that I would have to ask because I do not handle any insurances.  She requested not to schedule until we knew.  I called [facility] and they faxed me records.  I called [facility] and they requested for me to fax over a request on my letter head so I did.  I called Lenise and she said that the pt would have to call her insurance co to see.  Called pt back and told her.  She will call her insurance and then call me back to schedule or let me know to not schedule.

## 2014-06-30 ENCOUNTER — Encounter: Payer: Self-pay | Admitting: *Deleted

## 2014-07-04 ENCOUNTER — Telehealth: Payer: Self-pay | Admitting: *Deleted

## 2014-07-04 NOTE — Telephone Encounter (Signed)
Pt called and I confirmed 07/07/14 appt w/ pt.  Unable to mail before appt letter - gave verbal.  Unable to mail welcoming packet - gave directions and instructions.  Unable to mail intake form - placed a note for one to be given at time of check in.  Placed a copy of records in Dr. Virgie Dad box and took one to HIM to scan.

## 2014-07-07 ENCOUNTER — Ambulatory Visit: Payer: BLUE CROSS/BLUE SHIELD

## 2014-07-07 ENCOUNTER — Encounter: Payer: Self-pay | Admitting: Oncology

## 2014-07-07 ENCOUNTER — Telehealth: Payer: Self-pay | Admitting: *Deleted

## 2014-07-07 ENCOUNTER — Ambulatory Visit (HOSPITAL_BASED_OUTPATIENT_CLINIC_OR_DEPARTMENT_OTHER): Payer: BLUE CROSS/BLUE SHIELD | Admitting: Oncology

## 2014-07-07 ENCOUNTER — Other Ambulatory Visit (HOSPITAL_BASED_OUTPATIENT_CLINIC_OR_DEPARTMENT_OTHER): Payer: BLUE CROSS/BLUE SHIELD

## 2014-07-07 VITALS — BP 143/78 | HR 87 | Temp 98.1°F | Resp 18 | Ht 66.0 in | Wt 172.7 lb

## 2014-07-07 DIAGNOSIS — C50911 Malignant neoplasm of unspecified site of right female breast: Secondary | ICD-10-CM | POA: Diagnosis not present

## 2014-07-07 DIAGNOSIS — Z170 Estrogen receptor positive status [ER+]: Secondary | ICD-10-CM

## 2014-07-07 DIAGNOSIS — C773 Secondary and unspecified malignant neoplasm of axilla and upper limb lymph nodes: Secondary | ICD-10-CM | POA: Diagnosis not present

## 2014-07-07 NOTE — Telephone Encounter (Signed)
error 

## 2014-07-07 NOTE — Progress Notes (Signed)
Checked in new pt with no financial concerns.  Informed pt if chemo is part of her treatment we will contact her ins to see if Josem Kaufmann is required and will obtain it if it is as well as contact foundations that offer copay assistance if needed.  She has my card for any billing questions or concerns.

## 2014-07-07 NOTE — Progress Notes (Signed)
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 She was recalled for a possible abnormality in the right breast and additional views with ultrasonography led to biopsy 02/22/2014, which showed (MS [id number]) and invasive ductal carcinoma, grade 3, estrogen receptor 85% positive, progesterone receptor negative, with HER-2 equivocal, the quantitative score being 2+. Fish was performed and showed a signals ratio of 1.2, number per cell being 3.6.  The patient then had an Oncotype DX since with the surprisingly high score of 53. This predicts a high risk of recurrence outside the breast with antiestrogen treatment only. It also predicts significant benefit from chemotherapy.  Accordingly the patient was started on cyclophosphamide and docetaxel. She tells me that at the start of chemotherapy she already had a "red spots" in her right axilla. This was interpreted as a possible infection or inflammation, but as it did not respond to antibiotic treatment, biopsy of the right axillary area 05/31/2014 showed ([place]) invasive ductal carcinoma, grade 3, estrogen receptor again positive at approximately 60%, progesterone receptor again negative, HER-2 again equivocal, with Fish repeated showing a again a signals ratio of 1.2 and the number per cell of 3.3.  At that point the patient was staged with a PET scan obtained 06/12/2014. It showed a 2.4 cm hypodensity in the right lobe of the liver as well as focal  update in the right lateral breast (2.6 cm, and the right axilla (9 mm) there was no other area suspicious for malignancy.  The patient underwent biopsy of the liver lesion at [facility] 07/05/2014. Samples from that procedure were also sent for [facility]. Results are pending. The patient was started on letrozole 06/28/2014 and has Palbociclib in hand, expecting to begin those treatments next week.  Her subsequent history is as detailed below  INTERVAL HISTORY: Jenna Pitts was evaluated in the breast clinic 07/07/2014  REVIEW OF SYSTEMS: She tells me she did remarkably well with her chemotherapy. Currently she is tolerating the letrozole well, with no hot flashes, vaginal dryness problems, or some of the arthralgias or myalgias that patients can experience on that medication. She denies unusual headaches, visual changes, dizziness, gait imbalance, nausea, or vomiting. She exercises chiefly by walking about a mile and a half daily. She admits to some stress urinary incontinence and some discomfort in the right axilla and at the site of the recent liver biopsy.. A detailed review of systems today was otherwise entirely benign.  PAST MEDICAL HISTORY: Past Medical History  Diagnosis Date  . Blood transfusion without reported diagnosis 1977    BIRTH OF SON    PAST SURGICAL HISTORY: Past Surgical History  Procedure Laterality Date  . Cesarean section       X2  . Breast lumpectomy with axillary lymph node biopsy      FAMILY HISTORY Family History  Problem Relation Age of Onset  . Cancer Mother     COLON  . Cancer Maternal Grandfather     COLON   the patient knows little about her father, who left the family when she was  age 49. The patient's mother died at age 24 from colon cancer diagnosed when she was 53. The patient's maternal grandfather also had colon cancer diagnosed in his 23s. The patient has 2 brothers, 2 sisters. One sister has a history of melanoma. There is no history of breast or ovarian cancer  in the family.  GYNECOLOGIC HISTORY:  No LMP recorded. Patient is postmenopausal. Menarche age 28, first live birth age 68. The patient is GX P2. She went through menopause in her early 63s. She did not take hormone replacement. 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 Her husband [name], was a Music therapist now runs the [employer] overlay service. Their daughter [name] is a Development worker, community in [place]. Son [name] lives in [place] or he works as a Music therapist. The patient has 5 grandchildren. She attends an independent [place]: Not in place   HEALTH MAINTENANCE: History  Substance Use Topics  . Smoking status: Never Smoker   . Smokeless tobacco: Never Used  . Alcohol Use: No     Colonoscopy: 2005, in by mouth area  PAP:  Bone density: 2005, "OK"  Lipid panel:  Allergies  Allergen Reactions  . Aspirin     RINGING EAR.    Current Outpatient Prescriptions  Medication Sig Dispense Refill  . Bioflavonoid Products (ESTER-C) TABS Take by mouth.    . Calcium-Magnesium-Zinc (CALCIUM-MAGNESUIUM-ZINC PO) Take by mouth.    . letrozole (FEMARA) 2.5 MG tablet Take 1 tablet (2.5 mg total) by mouth daily. 30 tablet 11  . Multiple Vitamin (MULTIVITAMIN) capsule Take 1 capsule by mouth daily.     No current facility-administered medications for this visit.    OBJECTIVE: Middle-aged white woman in no acute distress Filed Vitals:   07/07/14 0827  BP: 143/78  Pulse: 87  Temp: 98.1 F (36.7 C)  Resp: 18     Body mass index is 27.89 kg/(m^2).    ECOG FS:1 - Symptomatic but completely ambulatory  Ocular: Sclerae unicteric, pupils equal, round and reactive to light, EOMs intact Ear-nose-throat: Oropharynx clear, dentition in good repair Lymphatic: No  cervical or supraclavicular adenopathy Lungs no rales or rhonchi, good excursion bilaterally Heart regular rate and rhythm, no murmur appreciated Abd soft, nontender, positive bowel sounds MSK no focal spinal tenderness, no right upper extremity edema Neuro: non-focal, well-oriented, appropriate affect Breasts: I do not palpate a mass in the right breast. In the right axilla there is a very obvious 2 cm erythematous protuberance, imaged below. The left breast is unremarkable  Photo obtained 07/07/2014    LAB RESULTS:  CMP     Component Value Date/Time   NA 138 06/16/2014 0918   NA 141 01/12/2014 0842   K 3.8 06/16/2014 0918   K 4.6 01/12/2014 0842   CL 104 06/16/2014 0918   CL 103 01/12/2014 0842   CO2 26 06/16/2014 0918   CO2 27 01/12/2014 0842   GLUCOSE 120* 06/16/2014 0918   GLUCOSE 86 01/12/2014 0842   BUN 10 06/16/2014 0918   BUN 8 01/12/2014 0842   CREATININE 0.60 06/16/2014 0918   CREATININE 0.68 01/12/2014 0842   CREATININE 0.71 11/06/2009 2210   CALCIUM 9.3 06/16/2014 0918   CALCIUM 9.9 01/12/2014 0842   PROT 7.4 06/16/2014 0918   PROT 7.3 01/12/2014 7425  ALBUMIN 4.0 06/16/2014 0918   ALBUMIN 4.2 01/12/2014 0842   AST 25 06/16/2014 0918   AST 18 01/12/2014 0842   ALT 17 06/16/2014 0918   ALT 12 01/12/2014 0842   ALKPHOS 84 06/16/2014 0918   ALKPHOS 85 01/12/2014 0842   BILITOT 0.8 01/12/2014 0842   GFRNONAA >60 06/16/2014 0918   GFRAA >60 06/16/2014 0918    INo results found for: SPEP, UPEP  Lab Results  Component Value Date   WBC 4.1 06/16/2014   NEUTROABS 2.6 06/16/2014   HGB 12.3 06/16/2014   HCT 37.5 06/16/2014   MCV 94 06/16/2014   PLT 230 06/16/2014      Chemistry      Component Value Date/Time   NA 138 06/16/2014 0918   NA 141 01/12/2014 0842   K 3.8 06/16/2014 0918   K 4.6 01/12/2014 0842   CL 104 06/16/2014 0918   CL 103 01/12/2014 0842   CO2 26 06/16/2014 0918   CO2 27 01/12/2014 0842   BUN 10 06/16/2014 0918   BUN 8  01/12/2014 0842   CREATININE 0.60 06/16/2014 0918   CREATININE 0.68 01/12/2014 0842   CREATININE 0.71 11/06/2009 2210      Component Value Date/Time   CALCIUM 9.3 06/16/2014 0918   CALCIUM 9.9 01/12/2014 0842   ALKPHOS 84 06/16/2014 0918   ALKPHOS 85 01/12/2014 0842   AST 25 06/16/2014 0918   AST 18 01/12/2014 0842   ALT 17 06/16/2014 0918   ALT 12 01/12/2014 0842   BILITOT 0.8 01/12/2014 0842       No results found for: LABCA2  No components found for: [id number]  No results for input(s): INR in the last 168 hours.  Urinalysis No results found for: COLORURINE, APPEARANCEUR, LABSPEC, PHURINE, GLUCOSEU, HGBUR, BILIRUBINUR, KETONESUR, PROTEINUR, UROBILINOGEN, NITRITE, LEUKOCYTESUR  STUDIES: Nm Rest Muga Scan 2 Of 2 ([facility] Hx)  06/16/2014   CLINICAL DATA:  Evaluate left ventricular ejection fraction prior to beginning chemotherapy  EXAM: NUCLEAR MEDICINE CARDIAC BLOOD POOL IMAGING (MUGA)  TECHNIQUE: Cardiac multi-gated acquisition was performed at rest following intravenous injection of Tc-64mlabeled red blood cells.  RADIOPHARMACEUTICALS:  23.8 mCiTc-988mn-vitro labeled red blood cells.  COMPARISON:  None  FINDINGS: Normal left ventricular wall motion.  The calculated left ventricular ejection fraction equals 61.8%.  IMPRESSION: 1. Left ventricular ejection fraction equals 61.8%.   Electronically Signed   By: TaKerby Moors.D.   On: 06/16/2014 08:18    ASSESSMENT: [age][place] post right lumpectomy and sentinel lymph node sampling 03/06/2014 for a pT1c pN0, stage IA invasive ductal carcinoma, grade 3, estrogen receptor positive, progesterone receptor negative, with HER-2 equivocal by immunohistochemistry but not amplified by FISH  (1) Oncotype DX score of 53 predicts a 10 year risk of recurrence outside the breast greater than 30% if the patient's only systemic treatment is tamoxifen for 5 years. It also predicts significant benefit from  chemotherapy  (2) the patient received 2 cycles of cyclophosphamide and docetaxel completed 05/08/2014;  (3) the patient had residual measurable disease in the right axilla noted at the start of chemotherapy, with biopsy 05/31/2014 showing this to be residual invasive ductal carcinoma, with an identical prognostic profile to the January biopsy  (4) PET scan 06/12/2014 showed, in addition to residual disease in the right breast and right axilla, a 2.4 cm right liver lesion which was biopsied 07/05/2014 (results pending)  (5) UNCseq requested from liver biopsy samples, results pending  (6) letrozole  started 06/28/2014, palbociclib to be started later this month  PLAN: I spent approximately an hour with Jenna Pitts going over her diagnosis, treatment course and prognosis. She understands I'll act a couple of important pieces of information, especially the results of her liver biopsy obtained 2 days ago. Nevertheless I think the main purpose of her visit today was to get a good understanding of the overall situation  She understands that stage IV breast cancer is not curable with our current knowledge base. The goal of treatment is control. The strategy of treatment is to do only the minimum necessary to control the growth of the tumor so that the patient can have as normal a life as possible. There is no survival advantage in treating aggressively if treating less aggressively results in tumor control. With this strategy stage IV breast cancer in many cases can function as a "chronic illness":  something that cannot be quite gotten rid of but can be controlled for an indefinite period of time  There are always 3 questions to go over and so we reviewed those today. The first question is do we treat or not. In some cases the patient's overall situation is so discouraging that palliative/comfort care alone is appropriate. If the decision is made to treat, then the next question is whether anti-estrogens or  chemotherapy is more appropriate. Once that decision is made than the third question is: Which agent or combination of agents in particular should be used?  In Jenna Pitts's case clearly we should proceed to treatment. The decision to start with anti-estrogens plus Palbociclib is entirely reasonable and standard. It has at least as good a chance of success as proceeding with chemotherapy (which at best controlled the growth of the measurable disease in the right axilla). She had some concerns regarding Palbociclib and we discussed the possible toxicities, side effects and complications of that agent in detail. I reassured her that most of my patients tolerate it quite well, and that in studies it doubled the time to disease progression when coupled with letrozole  If after 2 or 3 months this particular treatment has not worked, I would be interested in giving anti-HER-2 immunotherapy a try. This tumor has been unusually aggressive from day 1. I would consider Taxol with trastuzumab and pertuzumab next based on the persistently equivocal results of her biopsies. Alternatively results from the [facility] probes might more specifically guide therapy at that point. Then if that did not work perhaps exemestane and everolimus could be next.  I will be glad to see Jenna Pitts again at her or her 56 discretion. She has excellent care however in [place] and at [facility]. Accordingly we did not made a routine return appointment here. She knows to call for any problems that we may be helpful with in the future.  Chauncey Cruel, MD   07/07/2014 9:41 AM Medical Oncology and Hematology [facility] [address] [place], [place] [address] Tel. [phone]    Fax. [phone]

## 2014-07-10 ENCOUNTER — Telehealth: Payer: Self-pay | Admitting: *Deleted

## 2014-07-10 ENCOUNTER — Other Ambulatory Visit: Payer: Self-pay | Admitting: Internal Medicine

## 2014-07-10 ENCOUNTER — Other Ambulatory Visit: Payer: Self-pay | Admitting: Oncology

## 2014-07-10 DIAGNOSIS — C50911 Malignant neoplasm of unspecified site of right female breast: Secondary | ICD-10-CM

## 2014-07-10 NOTE — Telephone Encounter (Signed)
I called pt today to let her know that she will get lab work every 3 weeks and then on 9 weeks she will have labs and see you. Secretary has not made appt yet.  I did see labs for 6/6, 6/27, and 7/18.  When she went for the second opinion in [place] he told her that he monitors labs every week for several weeks when first on drug.  She has read the literature on it and it says every 10 days. She just wants to make sure she does not need to have labs sooner. Let me know. I told her I would speak to you in am and call her back.

## 2014-07-10 NOTE — Progress Notes (Unsigned)
,   Hy [[email or web address]]  Sent: Sunday, Jul 09, 2014 9:11 PM To: Bartosz Luginbill, [email or web address] Cc: [email or web address] Subject: Re: secure  Thanks for caring for her gas. Her her two was negative buy fish. 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 I am waiting ER PR and HER2.  Also sending tissue for our [facility] mutation panel  Hy  Sent from my iPhone

## 2014-07-13 ENCOUNTER — Telehealth: Payer: Self-pay | Admitting: *Deleted

## 2014-07-13 NOTE — Telephone Encounter (Signed)
I called pt to let her know of the appt made for her weekly labs, she got the email about the appts. She did mention that the spot of breast cancer was rubbing on her bra and she has new bra and it had some blood on it and it was minimal and she put bandaid with neosporin on it. I told her that I would ask pandit and let her know tom.  She also states that the liver bx shows metastatic breast and they are awaiting her status.

## 2014-07-14 ENCOUNTER — Other Ambulatory Visit: Payer: Self-pay | Admitting: *Deleted

## 2014-07-14 DIAGNOSIS — C50911 Malignant neoplasm of unspecified site of right female breast: Secondary | ICD-10-CM

## 2014-07-14 DIAGNOSIS — Z170 Estrogen receptor positive status [ER+]: Principal | ICD-10-CM

## 2014-07-17 ENCOUNTER — Inpatient Hospital Stay: Payer: BLUE CROSS/BLUE SHIELD | Attending: Internal Medicine

## 2014-07-17 ENCOUNTER — Telehealth: Payer: Self-pay | Admitting: *Deleted

## 2014-07-17 DIAGNOSIS — D72829 Elevated white blood cell count, unspecified: Secondary | ICD-10-CM | POA: Insufficient documentation

## 2014-07-17 DIAGNOSIS — Z79899 Other long term (current) drug therapy: Secondary | ICD-10-CM | POA: Insufficient documentation

## 2014-07-17 DIAGNOSIS — C50411 Malignant neoplasm of upper-outer quadrant of right female breast: Secondary | ICD-10-CM | POA: Insufficient documentation

## 2014-07-17 DIAGNOSIS — C50911 Malignant neoplasm of unspecified site of right female breast: Secondary | ICD-10-CM

## 2014-07-17 DIAGNOSIS — Z170 Estrogen receptor positive status [ER+]: Secondary | ICD-10-CM | POA: Insufficient documentation

## 2014-07-17 LAB — CBC WITH DIFFERENTIAL/PLATELET
BASOS ABS: 0 10*3/uL (ref 0–0.1)
BASOS PCT: 1 %
EOS PCT: 1 %
Eosinophils Absolute: 0 10*3/uL (ref 0–0.7)
HCT: 38.7 % (ref 35.0–47.0)
HEMOGLOBIN: 12.5 g/dL (ref 12.0–16.0)
LYMPHS PCT: 23 %
Lymphs Abs: 0.8 10*3/uL — ABNORMAL LOW (ref 1.0–3.6)
MCH: 30.6 pg (ref 26.0–34.0)
MCHC: 32.4 g/dL (ref 32.0–36.0)
MCV: 94.3 fL (ref 80.0–100.0)
MONOS PCT: 3 %
Monocytes Absolute: 0.1 10*3/uL — ABNORMAL LOW (ref 0.2–0.9)
NEUTROS ABS: 2.6 10*3/uL (ref 1.4–6.5)
Neutrophils Relative %: 72 %
Platelets: 225 10*3/uL (ref 150–440)
RBC: 4.1 MIL/uL (ref 3.80–5.20)
RDW: 14.2 % (ref 11.5–14.5)
WBC: 3.6 10*3/uL (ref 3.6–11.0)

## 2014-07-17 LAB — CREATININE, SERUM
Creatinine, Ser: 0.75 mg/dL (ref 0.44–1.00)
GFR calc non Af Amer: >60 mL/min (ref >60)

## 2014-07-17 LAB — HEPATIC FUNCTION PANEL
ALT: 30 U/L (ref 14–54)
AST: 31 U/L (ref 15–41)
Albumin: 4.5 g/dL (ref 3.5–5.0)
Alkaline Phosphatase: 84 U/L (ref 38–126)
Bilirubin, Direct: <0.1 mg/dL — ABNORMAL LOW (ref 0.1–0.5)
Total Bilirubin: 0.8 mg/dL (ref 0.3–1.2)
Total Protein: 7.7 g/dL (ref 6.5–8.1)

## 2014-07-17 NOTE — Telephone Encounter (Signed)
Pt had mentioned to me on Friday that she had small amount of dried blood on her bra from the breast tumor that was coming out of her skin.  She thought it was her bra friction that caused it and got a new bra and she was going to see if that made it better. She did put atb cream and bandaid over it. This morning she called and it had got worse- she explained that it looked by pimple and came to a head and then oozing out white purulent and bloody drainage.  I spoke to [place] on call and he rec: radiation to the area, spoke to [place] radiation md and he was in agreement also.  Got appt for pt wed 5/25 at 1:30 and I called pt and explained to her that the plan is radiation to the area.  Also explained that she will need to come off letrozole and Ibrance while getting radiation and the radiation doctor will explain.

## 2014-07-19 ENCOUNTER — Encounter: Payer: Self-pay | Admitting: Radiation Oncology

## 2014-07-19 ENCOUNTER — Ambulatory Visit
Admission: RE | Admit: 2014-07-19 | Discharge: 2014-07-19 | Disposition: A | Discharge status: Home / Self Care | Payer: BLUE CROSS/BLUE SHIELD | Source: Ambulatory Visit | Attending: Radiation Oncology | Admitting: Radiation Oncology

## 2014-07-19 ENCOUNTER — Other Ambulatory Visit: Payer: BLUE CROSS/BLUE SHIELD | Admitting: *Deleted

## 2014-07-19 VITALS — BP 129/86 | HR 84 | Temp 96.8°F | Resp 20 | Wt 175.5 lb

## 2014-07-19 DIAGNOSIS — Z510 Encounter for antineoplastic radiation therapy: Secondary | ICD-10-CM | POA: Insufficient documentation

## 2014-07-19 DIAGNOSIS — C50411 Malignant neoplasm of upper-outer quadrant of right female breast: Secondary | ICD-10-CM | POA: Diagnosis not present

## 2014-07-19 DIAGNOSIS — C50911 Malignant neoplasm of unspecified site of right female breast: Secondary | ICD-10-CM | POA: Insufficient documentation

## 2014-07-19 DIAGNOSIS — L24A9 Irritant contact dermatitis due friction or contact with other specified body fluids: Secondary | ICD-10-CM

## 2014-07-19 DIAGNOSIS — T148XXA Other injury of unspecified body region, initial encounter: Secondary | ICD-10-CM

## 2014-07-19 NOTE — Consult Note (Signed)
Radiation Oncology NEW PATIENT EVALUATION  Name: Jenna Pitts  MRN: [id number]  Date:   07/19/2014     DOB: 08-24-1950   This 64 y.o. female patient presents to the clinic for initial evaluation of stage IV breast cancer.  REFERRING PHYSICIAN: Ria Bush, MD  CHIEF COMPLAINT:  Chief Complaint  Patient presents with  . Breast Cancer    Pt is here for initial consultation for breast cancer.     DIAGNOSIS: The encounter diagnosis was Malignant neoplasm of female breast, right.   PREVIOUS INVESTIGATIONS:  PET scan mammograms reports reviewed films requested for my review Surgical pathology reports reviewed Clinical notes reviewed  HPI: Patient is a 64 year old female who originally presented with an abnormal mammogram back in December 2015 of the right breast core biopsy correlation showed grade 3 invasive ductal carcinoma ER positive HER-2/neu 2+ but fish negative. Status post wide local excision and sentinel node biopsy for 1.8 cm grade 3 invasive mammary carcinoma margins negative one sentinel lymph node negative for metastatic disease. Oncotype DX score was extremely high at 53. She was seen at [facility] where she had her surgery by medical oncology and adjuvant chemotherapy based on the high Oncotype DX score of Taxol cyclophosphamide was made. She subsequently underwent 2 cycles although developed a mass in the right axillary tail which eventually was biopsied and again positive for invasive mammary carcinoma. PET CT at this time showed hypermetabolic activity in the right breast in the region of the mass as well as axillary lymph nodes on the right. She is also noted to have a liver lesion which again underwent biopsy which was again positive for metastatic breast cancer. Lesions continue to grow is being cultured today for possible infection. She's not having any real significant pain in that area. I been asked to evaluate the patient for possible palliative radiation therapy to her  right breast at this time  PLANNED TREATMENT REGIMEN: Palliative radiation therapy to right breast and peripheral lymphatics  PAST MEDICAL HISTORY:  has a past medical history of Blood transfusion without reported diagnosis (1977) and Breast cancer.    PAST SURGICAL HISTORY:  Past Surgical History  Procedure Laterality Date  . Cesarean section       X2  . Breast lumpectomy with axillary lymph node biopsy      FAMILY HISTORY: family history includes Cancer in her maternal grandfather and mother.  SOCIAL HISTORY:  reports that she has never smoked. She has never used smokeless tobacco. She reports that she does not drink alcohol or use illicit drugs.  ALLERGIES: Aspirin and Pseudoephedrine  MEDICATIONS:  Current Outpatient Prescriptions  Medication Sig Dispense Refill  . Bioflavonoid Products (ESTER-C) TABS Take by mouth.    . Calcium-Magnesium-Zinc (CALCIUM-MAGNESUIUM-ZINC PO) Take by mouth.    . diphenhydrAMINE (BENADRYL) 25 mg capsule Take 25 mg by mouth.    Leslee Home 125 MG capsule   5  . letrozole (FEMARA) 2.5 MG tablet Take 1 tablet (2.5 mg total) by mouth daily. 30 tablet 11  . Multiple Vitamin (MULTIVITAMIN) capsule Take 1 capsule by mouth daily.    . promethazine (PHENERGAN) 25 MG tablet   2   No current facility-administered medications for this encounter.    ECOG PERFORMANCE STATUS:  0 - Asymptomatic  REVIEW OF SYSTEMS:  Patient denies any weight loss, fatigue, weakness, fever, chills or night sweats. Patient denies any loss of vision, blurred vision. Patient denies any ringing  of the ears or hearing loss. No irregular heartbeat. Patient  denies heart murmur or history of fainting. Patient denies any chest pain or pain radiating to her upper extremities. Patient denies any shortness of breath, difficulty breathing at night, cough or hemoptysis. Patient denies any swelling in the lower legs. Patient denies any nausea vomiting, vomiting of blood, or coffee ground material  in the vomitus. Patient denies any stomach pain. Patient states has had normal bowel movements no significant constipation or diarrhea. Patient denies any dysuria, hematuria or significant nocturia. Patient denies any problems walking, swelling in the joints or loss of balance. Patient denies any skin changes, loss of hair or loss of weight. Patient denies any excessive worrying or anxiety or significant depression. Patient denies any problems with insomnia. Patient denies excessive thirst, polyuria, polydipsia. Patient denies any swollen glands, patient denies easy bruising or easy bleeding. Patient denies any recent infections, allergies or URI. Patient "s visual fields have not changed significantly in recent time.    PHYSICAL EXAM: BP 129/86 mmHg  Pulse 84  Temp(Src) 96.8 F (36 C)  Resp 20  Wt 175 lb 7.8 oz (79.6 kg) Well-developed female in NAD. She status post wide local excision. She has a pedunculated mass which is extremely erythematous extending out of the right axillary tail with underlying firm mass measuring approximately 3 cm. No axillary or supraclavicular adenopathy bilaterally is noted. No dominant mass is noted in the left breast. Well-developed well-nourished patient in NAD. HEENT reveals PERLA, EOMI, discs not visualized.  Oral cavity is clear. No oral mucosal lesions are identified. Neck is clear without evidence of cervical or supraclavicular adenopathy. Lungs are clear to A&P. Cardiac examination is essentially unremarkable with regular rate and rhythm without murmur rub or thrill. Abdomen is benign with no organomegaly or masses noted. Motor sensory and DTR levels are equal and symmetric in the upper and lower extremities. Cranial nerves II through XII are grossly intact. Proprioception is intact. No peripheral adenopathy or edema is identified. No motor or sensory levels are noted. Crude visual fields are within normal range.   LABORATORY DATA:  Results for orders placed or  performed in visit on 07/17/14 (from the past 72 hour(s))  CBC with Differential     Status: Abnormal   Collection Time: 07/17/14 11:45 AM  Result Value Ref Range   WBC 3.6 3.6 - 11.0 K/uL   RBC 4.10 3.80 - 5.20 MIL/uL   Hemoglobin 12.5 12.0 - 16.0 g/dL   HCT 38.7 35.0 - 47.0 %   MCV 94.3 80.0 - 100.0 fL   MCH 30.6 26.0 - 34.0 pg   MCHC 32.4 32.0 - 36.0 g/dL   RDW 14.2 11.5 - 14.5 %   Platelets 225 150 - 440 K/uL   Neutrophils Relative % 72 %   Neutro Abs 2.6 1.4 - 6.5 K/uL   Lymphocytes Relative 23 %   Lymphs Abs 0.8 (L) 1.0 - 3.6 K/uL   Monocytes Relative 3 %   Monocytes Absolute 0.1 (L) 0.2 - 0.9 K/uL   Eosinophils Relative 1 %   Eosinophils Absolute 0.0 0 - 0.7 K/uL   Basophils Relative 1 %   Basophils Absolute 0.0 0 - 0.1 K/uL  Creatinine, serum     Status: None   Collection Time: 07/17/14 11:45 AM  Result Value Ref Range   Creatinine, Ser 0.75 0.44 - 1.00 mg/dL   GFR calc non Af Amer >60 >60 mL/min   GFR calc Af Amer >60 >60 mL/min    Comment: (NOTE) The eGFR has been calculated using  the CKD EPI equation. This calculation has not been validated in all clinical situations. eGFR's persistently <60 mL/min signify possible Chronic Kidney Disease.   Hepatic function panel     Status: Abnormal   Collection Time: 07/17/14 11:45 AM  Result Value Ref Range   Total Protein 7.7 6.5 - 8.1 g/dL   Albumin 4.5 3.5 - 5.0 g/dL   AST 31 15 - 41 U/L   ALT 30 14 - 54 U/L   Alkaline Phosphatase 84 38 - 126 U/L   Total Bilirubin 0.8 0.3 - 1.2 mg/dL   Bilirubin, Direct <0.1 (L) 0.1 - 0.5 mg/dL   Indirect Bilirubin NOT CALCULATED 0.3 - 0.9 mg/dL     RADIOLOGY RESULTS: No results found.  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 Status post 2 cycles of TC with stage IV disease by biopsy-proven liver metastasis  PLAN: At  this time I would like to go ahead and try some palliative radiation therapy to her right breast and peripheral lymphatics. We'll plan on delivering 5000 cGy to both areas and evaluate for response. Should we see no dramatic response I believe the patient may benefit from salvage mastectomy since eventually this lesion will cause ulceration of the skin and possibly narcotic dependent pain. Have discussed the case with medical oncology.. I have set up and ordered CT simulation tomorrow will begin treatments early next week. Again should lesion progressed to radiation will refer back to surgeon at [facility] as quickly as possible for consideration of mastectomy. Patient is aware of my recommendations and treatment plan. Side effects of radiation including possible lymphedema, skin reaction, inclusion of some superficial lung, alteration of blood counts, all were described in detail to the patient and her husband. They both seem to comprehend my treatment plan well.  I would like to take this opportunity for allowing me to participate in the care of your patient.Armstead Peaks., MD

## 2014-07-20 ENCOUNTER — Ambulatory Visit
Admission: RE | Admit: 2014-07-20 | Discharge: 2014-07-20 | Disposition: A | Discharge status: Home / Self Care | Payer: BLUE CROSS/BLUE SHIELD | Source: Ambulatory Visit | Attending: Radiation Oncology | Admitting: Radiation Oncology

## 2014-07-20 DIAGNOSIS — C50911 Malignant neoplasm of unspecified site of right female breast: Secondary | ICD-10-CM | POA: Diagnosis present

## 2014-07-20 DIAGNOSIS — Z510 Encounter for antineoplastic radiation therapy: Secondary | ICD-10-CM | POA: Diagnosis not present

## 2014-07-21 ENCOUNTER — Other Ambulatory Visit: Payer: Self-pay | Admitting: *Deleted

## 2014-07-24 LAB — WOUND CULTURE: CULTURE: NO GROWTH

## 2014-07-25 ENCOUNTER — Inpatient Hospital Stay: Payer: BLUE CROSS/BLUE SHIELD

## 2014-07-25 DIAGNOSIS — C50911 Malignant neoplasm of unspecified site of right female breast: Secondary | ICD-10-CM

## 2014-07-25 DIAGNOSIS — C50411 Malignant neoplasm of upper-outer quadrant of right female breast: Secondary | ICD-10-CM | POA: Diagnosis not present

## 2014-07-25 DIAGNOSIS — Z170 Estrogen receptor positive status [ER+]: Principal | ICD-10-CM

## 2014-07-25 LAB — CBC WITH DIFFERENTIAL/PLATELET
Basophils Absolute: 0 10*3/uL (ref 0–0.1)
Basophils Relative: 1 %
EOS ABS: 0 10*3/uL (ref 0–0.7)
HEMATOCRIT: 37.2 % (ref 35.0–47.0)
HEMOGLOBIN: 12.1 g/dL (ref 12.0–16.0)
LYMPHS ABS: 0.9 10*3/uL — AB (ref 1.0–3.6)
MCH: 30.8 pg (ref 26.0–34.0)
MCHC: 32.4 g/dL (ref 32.0–36.0)
MCV: 95 fL (ref 80.0–100.0)
Monocytes Absolute: 0.1 10*3/uL — ABNORMAL LOW (ref 0.2–0.9)
Monocytes Relative: 5 %
Neutro Abs: 0.8 10*3/uL — ABNORMAL LOW (ref 1.4–6.5)
Neutrophils Relative %: 46 %
Platelets: 143 10*3/uL — ABNORMAL LOW (ref 150–440)
RBC: 3.92 MIL/uL (ref 3.80–5.20)
RDW: 14.1 % (ref 11.5–14.5)
WBC: 1.9 10*3/uL — ABNORMAL LOW (ref 3.6–11.0)

## 2014-07-25 LAB — HEPATIC FUNCTION PANEL
ALT: 34 U/L (ref 14–54)
AST: 28 U/L (ref 15–41)
Albumin: 4.4 g/dL (ref 3.5–5.0)
Alkaline Phosphatase: 108 U/L (ref 38–126)
Bilirubin, Direct: 0.1 mg/dL (ref 0.1–0.5)
Indirect Bilirubin: 0.7 mg/dL (ref 0.3–0.9)
TOTAL PROTEIN: 8 g/dL (ref 6.5–8.1)
Total Bilirubin: 0.8 mg/dL (ref 0.3–1.2)

## 2014-07-25 LAB — CREATININE, SERUM
CREATININE: 0.73 mg/dL (ref 0.44–1.00)
GFR calc Af Amer: >60 mL/min (ref >60)
GFR calc non Af Amer: >60 mL/min (ref >60)

## 2014-07-26 ENCOUNTER — Ambulatory Visit
Admission: RE | Admit: 2014-07-26 | Discharge: 2014-07-26 | Disposition: A | Discharge status: Home / Self Care | Payer: BLUE CROSS/BLUE SHIELD | Source: Ambulatory Visit | Attending: Radiation Oncology | Admitting: Radiation Oncology

## 2014-07-26 DIAGNOSIS — C50911 Malignant neoplasm of unspecified site of right female breast: Secondary | ICD-10-CM | POA: Diagnosis not present

## 2014-07-27 ENCOUNTER — Ambulatory Visit
Admission: RE | Admit: 2014-07-27 | Discharge: 2014-07-27 | Disposition: A | Discharge status: Home / Self Care | Payer: BLUE CROSS/BLUE SHIELD | Source: Ambulatory Visit | Attending: Radiation Oncology | Admitting: Radiation Oncology

## 2014-07-27 ENCOUNTER — Ambulatory Visit: Payer: BLUE CROSS/BLUE SHIELD

## 2014-07-27 DIAGNOSIS — C50911 Malignant neoplasm of unspecified site of right female breast: Secondary | ICD-10-CM | POA: Diagnosis not present

## 2014-07-28 ENCOUNTER — Ambulatory Visit
Admission: RE | Admit: 2014-07-28 | Discharge: 2014-07-28 | Disposition: A | Discharge status: Home / Self Care | Payer: BLUE CROSS/BLUE SHIELD | Source: Ambulatory Visit | Attending: Radiation Oncology | Admitting: Radiation Oncology

## 2014-07-28 ENCOUNTER — Ambulatory Visit: Payer: BLUE CROSS/BLUE SHIELD

## 2014-07-28 DIAGNOSIS — C50911 Malignant neoplasm of unspecified site of right female breast: Secondary | ICD-10-CM | POA: Diagnosis not present

## 2014-07-31 ENCOUNTER — Ambulatory Visit
Admission: RE | Admit: 2014-07-31 | Discharge: 2014-07-31 | Disposition: A | Discharge status: Home / Self Care | Payer: BLUE CROSS/BLUE SHIELD | Source: Ambulatory Visit | Attending: Radiation Oncology | Admitting: Radiation Oncology

## 2014-07-31 ENCOUNTER — Inpatient Hospital Stay: Payer: BLUE CROSS/BLUE SHIELD | Attending: Internal Medicine

## 2014-07-31 ENCOUNTER — Ambulatory Visit: Payer: BLUE CROSS/BLUE SHIELD

## 2014-07-31 DIAGNOSIS — C50911 Malignant neoplasm of unspecified site of right female breast: Secondary | ICD-10-CM

## 2014-07-31 DIAGNOSIS — Z170 Estrogen receptor positive status [ER+]: Secondary | ICD-10-CM | POA: Diagnosis not present

## 2014-07-31 DIAGNOSIS — C50411 Malignant neoplasm of upper-outer quadrant of right female breast: Secondary | ICD-10-CM | POA: Insufficient documentation

## 2014-07-31 LAB — CBC WITH DIFFERENTIAL/PLATELET
Basophils Absolute: 0 10*3/uL (ref 0–0.1)
EOS ABS: 0 10*3/uL (ref 0–0.7)
HCT: 36.6 % (ref 35.0–47.0)
Hemoglobin: 11.8 g/dL — ABNORMAL LOW (ref 12.0–16.0)
Lymphocytes Relative: 45 %
Lymphs Abs: 0.7 10*3/uL — ABNORMAL LOW (ref 1.0–3.6)
MCH: 30.5 pg (ref 26.0–34.0)
MCHC: 32.3 g/dL (ref 32.0–36.0)
MCV: 94.4 fL (ref 80.0–100.0)
Monocytes Absolute: 0.1 10*3/uL — ABNORMAL LOW (ref 0.2–0.9)
Neutro Abs: 0.7 10*3/uL — ABNORMAL LOW (ref 1.4–6.5)
Neutrophils Relative %: 47 %
Platelets: 115 10*3/uL — ABNORMAL LOW (ref 150–440)
RBC: 3.87 MIL/uL (ref 3.80–5.20)
RDW: 14 % (ref 11.5–14.5)
WBC: 1.6 10*3/uL — ABNORMAL LOW (ref 3.6–11.0)

## 2014-07-31 LAB — HEPATIC FUNCTION PANEL
ALK PHOS: 129 U/L — AB (ref 38–126)
ALT: 33 U/L (ref 14–54)
AST: 32 U/L (ref 15–41)
Albumin: 4.5 g/dL (ref 3.5–5.0)
BILIRUBIN INDIRECT: 0.4 mg/dL (ref 0.3–0.9)
Bilirubin, Direct: 0.1 mg/dL (ref 0.1–0.5)
TOTAL PROTEIN: 7.5 g/dL (ref 6.5–8.1)
Total Bilirubin: 0.5 mg/dL (ref 0.3–1.2)

## 2014-07-31 LAB — CREATININE, SERUM
CREATININE: 0.8 mg/dL (ref 0.44–1.00)
GFR calc non Af Amer: >60 mL/min (ref >60)

## 2014-08-01 ENCOUNTER — Ambulatory Visit: Payer: BLUE CROSS/BLUE SHIELD

## 2014-08-01 ENCOUNTER — Ambulatory Visit
Admission: RE | Admit: 2014-08-01 | Discharge: 2014-08-01 | Disposition: A | Discharge status: Home / Self Care | Payer: BLUE CROSS/BLUE SHIELD | Source: Ambulatory Visit | Attending: Radiation Oncology | Admitting: Radiation Oncology

## 2014-08-01 ENCOUNTER — Ambulatory Visit: Payer: BLUE CROSS/BLUE SHIELD | Admitting: Radiation Oncology

## 2014-08-01 DIAGNOSIS — C50911 Malignant neoplasm of unspecified site of right female breast: Secondary | ICD-10-CM | POA: Diagnosis not present

## 2014-08-02 ENCOUNTER — Ambulatory Visit
Admission: RE | Admit: 2014-08-02 | Discharge: 2014-08-02 | Disposition: A | Discharge status: Home / Self Care | Payer: BLUE CROSS/BLUE SHIELD | Source: Ambulatory Visit | Attending: Radiation Oncology | Admitting: Radiation Oncology

## 2014-08-02 ENCOUNTER — Ambulatory Visit: Payer: BLUE CROSS/BLUE SHIELD

## 2014-08-02 DIAGNOSIS — C50911 Malignant neoplasm of unspecified site of right female breast: Secondary | ICD-10-CM | POA: Diagnosis not present

## 2014-08-03 ENCOUNTER — Ambulatory Visit
Admission: RE | Admit: 2014-08-03 | Discharge: 2014-08-03 | Disposition: A | Discharge status: Home / Self Care | Payer: BLUE CROSS/BLUE SHIELD | Source: Ambulatory Visit | Attending: Radiation Oncology | Admitting: Radiation Oncology

## 2014-08-03 ENCOUNTER — Ambulatory Visit: Payer: BLUE CROSS/BLUE SHIELD

## 2014-08-03 DIAGNOSIS — C50911 Malignant neoplasm of unspecified site of right female breast: Secondary | ICD-10-CM | POA: Diagnosis not present

## 2014-08-04 ENCOUNTER — Ambulatory Visit: Payer: BLUE CROSS/BLUE SHIELD

## 2014-08-04 ENCOUNTER — Ambulatory Visit
Admission: RE | Admit: 2014-08-04 | Discharge: 2014-08-04 | Disposition: A | Discharge status: Home / Self Care | Payer: BLUE CROSS/BLUE SHIELD | Source: Ambulatory Visit | Attending: Radiation Oncology | Admitting: Radiation Oncology

## 2014-08-04 DIAGNOSIS — C50911 Malignant neoplasm of unspecified site of right female breast: Secondary | ICD-10-CM | POA: Diagnosis not present

## 2014-08-07 ENCOUNTER — Ambulatory Visit
Admission: RE | Admit: 2014-08-07 | Discharge: 2014-08-07 | Disposition: A | Discharge status: Home / Self Care | Payer: BLUE CROSS/BLUE SHIELD | Source: Ambulatory Visit | Attending: Radiation Oncology | Admitting: Radiation Oncology

## 2014-08-07 ENCOUNTER — Inpatient Hospital Stay: Payer: BLUE CROSS/BLUE SHIELD

## 2014-08-07 ENCOUNTER — Ambulatory Visit: Payer: BLUE CROSS/BLUE SHIELD

## 2014-08-07 DIAGNOSIS — C50911 Malignant neoplasm of unspecified site of right female breast: Secondary | ICD-10-CM | POA: Diagnosis not present

## 2014-08-07 DIAGNOSIS — Z170 Estrogen receptor positive status [ER+]: Principal | ICD-10-CM

## 2014-08-07 DIAGNOSIS — C50411 Malignant neoplasm of upper-outer quadrant of right female breast: Secondary | ICD-10-CM | POA: Diagnosis not present

## 2014-08-07 LAB — HEPATIC FUNCTION PANEL
ALT: 26 U/L (ref 14–54)
AST: 28 U/L (ref 15–41)
Albumin: 4.1 g/dL (ref 3.5–5.0)
Alkaline Phosphatase: 123 U/L (ref 38–126)
Bilirubin, Direct: <0.1 mg/dL — ABNORMAL LOW (ref 0.1–0.5)
Total Bilirubin: 0.5 mg/dL (ref 0.3–1.2)
Total Protein: 7.5 g/dL (ref 6.5–8.1)

## 2014-08-07 LAB — CBC WITH DIFFERENTIAL/PLATELET
Basophils Absolute: 0.1 10*3/uL (ref 0–0.1)
Basophils Relative: 4 %
Eosinophils Absolute: 0 10*3/uL (ref 0–0.7)
HEMATOCRIT: 35.1 % (ref 35.0–47.0)
HEMOGLOBIN: 11.6 g/dL — AB (ref 12.0–16.0)
Lymphs Abs: 0.6 10*3/uL — ABNORMAL LOW (ref 1.0–3.6)
MCH: 31.4 pg (ref 26.0–34.0)
MCHC: 32.9 g/dL (ref 32.0–36.0)
MCV: 95.3 fL (ref 80.0–100.0)
MONO ABS: 0.3 10*3/uL (ref 0.2–0.9)
Neutro Abs: 0.7 10*3/uL — ABNORMAL LOW (ref 1.4–6.5)
Neutrophils Relative %: 42 %
Platelets: 199 10*3/uL (ref 150–440)
RBC: 3.69 MIL/uL — ABNORMAL LOW (ref 3.80–5.20)
RDW: 14.5 % (ref 11.5–14.5)
WBC: 1.7 10*3/uL — ABNORMAL LOW (ref 3.6–11.0)

## 2014-08-07 LAB — CREATININE, SERUM
CREATININE: 0.65 mg/dL (ref 0.44–1.00)
GFR calc Af Amer: >60 mL/min (ref >60)
GFR calc non Af Amer: >60 mL/min (ref >60)

## 2014-08-08 ENCOUNTER — Ambulatory Visit: Payer: BLUE CROSS/BLUE SHIELD

## 2014-08-08 ENCOUNTER — Ambulatory Visit
Admission: RE | Admit: 2014-08-08 | Discharge: 2014-08-08 | Disposition: A | Discharge status: Home / Self Care | Payer: BLUE CROSS/BLUE SHIELD | Source: Ambulatory Visit | Attending: Radiation Oncology | Admitting: Radiation Oncology

## 2014-08-08 DIAGNOSIS — C50911 Malignant neoplasm of unspecified site of right female breast: Secondary | ICD-10-CM | POA: Diagnosis not present

## 2014-08-09 ENCOUNTER — Ambulatory Visit: Payer: BLUE CROSS/BLUE SHIELD

## 2014-08-09 ENCOUNTER — Ambulatory Visit
Admission: RE | Admit: 2014-08-09 | Discharge: 2014-08-09 | Disposition: A | Discharge status: Home / Self Care | Payer: BLUE CROSS/BLUE SHIELD | Source: Ambulatory Visit | Attending: Radiation Oncology | Admitting: Radiation Oncology

## 2014-08-09 DIAGNOSIS — C50911 Malignant neoplasm of unspecified site of right female breast: Secondary | ICD-10-CM | POA: Diagnosis not present

## 2014-08-10 ENCOUNTER — Ambulatory Visit: Payer: BLUE CROSS/BLUE SHIELD

## 2014-08-10 ENCOUNTER — Ambulatory Visit
Admission: RE | Admit: 2014-08-10 | Discharge: 2014-08-10 | Disposition: A | Discharge status: Home / Self Care | Payer: BLUE CROSS/BLUE SHIELD | Source: Ambulatory Visit | Attending: Radiation Oncology | Admitting: Radiation Oncology

## 2014-08-10 DIAGNOSIS — C50911 Malignant neoplasm of unspecified site of right female breast: Secondary | ICD-10-CM | POA: Diagnosis not present

## 2014-08-11 ENCOUNTER — Ambulatory Visit
Admission: RE | Admit: 2014-08-11 | Discharge: 2014-08-11 | Disposition: A | Discharge status: Home / Self Care | Payer: BLUE CROSS/BLUE SHIELD | Source: Ambulatory Visit | Attending: Radiation Oncology | Admitting: Radiation Oncology

## 2014-08-11 ENCOUNTER — Ambulatory Visit: Payer: BLUE CROSS/BLUE SHIELD

## 2014-08-11 DIAGNOSIS — C50911 Malignant neoplasm of unspecified site of right female breast: Secondary | ICD-10-CM | POA: Diagnosis not present

## 2014-08-14 ENCOUNTER — Ambulatory Visit
Admission: RE | Admit: 2014-08-14 | Discharge: 2014-08-14 | Disposition: A | Discharge status: Home / Self Care | Payer: BLUE CROSS/BLUE SHIELD | Source: Ambulatory Visit | Attending: Radiation Oncology | Admitting: Radiation Oncology

## 2014-08-14 ENCOUNTER — Inpatient Hospital Stay: Payer: BLUE CROSS/BLUE SHIELD

## 2014-08-14 ENCOUNTER — Ambulatory Visit: Payer: BLUE CROSS/BLUE SHIELD

## 2014-08-14 DIAGNOSIS — Z170 Estrogen receptor positive status [ER+]: Principal | ICD-10-CM

## 2014-08-14 DIAGNOSIS — C50911 Malignant neoplasm of unspecified site of right female breast: Secondary | ICD-10-CM

## 2014-08-14 DIAGNOSIS — C50411 Malignant neoplasm of upper-outer quadrant of right female breast: Secondary | ICD-10-CM | POA: Diagnosis not present

## 2014-08-14 LAB — CBC WITH DIFFERENTIAL/PLATELET
Basophils Absolute: 0.1 10*3/uL (ref 0–0.1)
Basophils Relative: 5 %
Eosinophils Absolute: 0 10*3/uL (ref 0–0.7)
HCT: 34 % — ABNORMAL LOW (ref 35.0–47.0)
Hemoglobin: 11.2 g/dL — ABNORMAL LOW (ref 12.0–16.0)
LYMPHS ABS: 0.5 10*3/uL — AB (ref 1.0–3.6)
MCH: 31.3 pg (ref 26.0–34.0)
MCHC: 33 g/dL (ref 32.0–36.0)
MCV: 95 fL (ref 80.0–100.0)
Monocytes Absolute: 0.2 10*3/uL (ref 0.2–0.9)
Monocytes Relative: 11 %
NEUTROS ABS: 1.1 10*3/uL — AB (ref 1.4–6.5)
Neutrophils Relative %: 59 %
Platelets: 302 10*3/uL (ref 150–440)
RBC: 3.58 MIL/uL — ABNORMAL LOW (ref 3.80–5.20)
RDW: 15.6 % — AB (ref 11.5–14.5)
WBC: 1.9 10*3/uL — ABNORMAL LOW (ref 3.6–11.0)

## 2014-08-14 LAB — HEPATIC FUNCTION PANEL
ALK PHOS: 121 U/L (ref 38–126)
ALT: 31 U/L (ref 14–54)
AST: 34 U/L (ref 15–41)
Albumin: 3.8 g/dL (ref 3.5–5.0)
Total Bilirubin: 0.8 mg/dL (ref 0.3–1.2)
Total Protein: 7.2 g/dL (ref 6.5–8.1)

## 2014-08-14 LAB — CREATININE, SERUM: Creatinine, Ser: 0.68 mg/dL (ref 0.44–1.00)

## 2014-08-14 MED ORDER — HEPARIN SOD (PORK) LOCK FLUSH 100 UNIT/ML IV SOLN
INTRAVENOUS | Status: AC
  Filled 2014-08-14: qty 5

## 2014-08-15 ENCOUNTER — Ambulatory Visit
Admission: RE | Admit: 2014-08-15 | Discharge: 2014-08-15 | Disposition: A | Discharge status: Home / Self Care | Payer: BLUE CROSS/BLUE SHIELD | Source: Ambulatory Visit | Attending: Radiation Oncology | Admitting: Radiation Oncology

## 2014-08-15 ENCOUNTER — Ambulatory Visit: Payer: BLUE CROSS/BLUE SHIELD

## 2014-08-15 DIAGNOSIS — C50911 Malignant neoplasm of unspecified site of right female breast: Secondary | ICD-10-CM | POA: Diagnosis not present

## 2014-08-16 ENCOUNTER — Ambulatory Visit
Admission: RE | Admit: 2014-08-16 | Discharge: 2014-08-16 | Disposition: A | Discharge status: Home / Self Care | Payer: BLUE CROSS/BLUE SHIELD | Source: Ambulatory Visit | Attending: Radiation Oncology | Admitting: Radiation Oncology

## 2014-08-16 ENCOUNTER — Ambulatory Visit: Payer: BLUE CROSS/BLUE SHIELD

## 2014-08-16 DIAGNOSIS — C50911 Malignant neoplasm of unspecified site of right female breast: Secondary | ICD-10-CM | POA: Diagnosis not present

## 2014-08-17 ENCOUNTER — Ambulatory Visit
Admission: RE | Admit: 2014-08-17 | Discharge: 2014-08-17 | Disposition: A | Discharge status: Home / Self Care | Payer: BLUE CROSS/BLUE SHIELD | Source: Ambulatory Visit | Attending: Radiation Oncology | Admitting: Radiation Oncology

## 2014-08-17 ENCOUNTER — Ambulatory Visit: Payer: BLUE CROSS/BLUE SHIELD

## 2014-08-17 DIAGNOSIS — C50911 Malignant neoplasm of unspecified site of right female breast: Secondary | ICD-10-CM | POA: Diagnosis not present

## 2014-08-18 ENCOUNTER — Ambulatory Visit: Payer: BLUE CROSS/BLUE SHIELD

## 2014-08-18 ENCOUNTER — Ambulatory Visit
Admission: RE | Admit: 2014-08-18 | Discharge: 2014-08-18 | Disposition: A | Discharge status: Home / Self Care | Payer: BLUE CROSS/BLUE SHIELD | Source: Ambulatory Visit | Attending: Radiation Oncology | Admitting: Radiation Oncology

## 2014-08-18 DIAGNOSIS — C50911 Malignant neoplasm of unspecified site of right female breast: Secondary | ICD-10-CM | POA: Diagnosis not present

## 2014-08-21 ENCOUNTER — Inpatient Hospital Stay: Payer: BLUE CROSS/BLUE SHIELD

## 2014-08-21 ENCOUNTER — Ambulatory Visit: Payer: BLUE CROSS/BLUE SHIELD

## 2014-08-21 ENCOUNTER — Ambulatory Visit
Admission: RE | Admit: 2014-08-21 | Discharge: 2014-08-21 | Disposition: A | Discharge status: Home / Self Care | Payer: BLUE CROSS/BLUE SHIELD | Source: Ambulatory Visit | Attending: Radiation Oncology | Admitting: Radiation Oncology

## 2014-08-21 DIAGNOSIS — Z170 Estrogen receptor positive status [ER+]: Secondary | ICD-10-CM

## 2014-08-21 DIAGNOSIS — C50411 Malignant neoplasm of upper-outer quadrant of right female breast: Secondary | ICD-10-CM | POA: Diagnosis not present

## 2014-08-21 DIAGNOSIS — C50911 Malignant neoplasm of unspecified site of right female breast: Secondary | ICD-10-CM

## 2014-08-21 LAB — CBC WITH DIFFERENTIAL/PLATELET
Basophils Absolute: 0.1 10*3/uL (ref 0–0.1)
Basophils Relative: 4 %
Eosinophils Absolute: 0 10*3/uL (ref 0–0.7)
Eosinophils Relative: 1 %
HCT: 34.5 % — ABNORMAL LOW (ref 35.0–47.0)
Hemoglobin: 11.5 g/dL — ABNORMAL LOW (ref 12.0–16.0)
Lymphocytes Relative: 22 %
Lymphs Abs: 0.4 10*3/uL — ABNORMAL LOW (ref 1.0–3.6)
MCH: 31.7 pg (ref 26.0–34.0)
MCHC: 33.2 g/dL (ref 32.0–36.0)
MCV: 95.7 fL (ref 80.0–100.0)
Monocytes Absolute: 0.2 10*3/uL (ref 0.2–0.9)
Monocytes Relative: 9 %
Neutro Abs: 1.2 10*3/uL — ABNORMAL LOW (ref 1.4–6.5)
Neutrophils Relative %: 64 %
Platelets: 231 10*3/uL (ref 150–440)
RBC: 3.61 MIL/uL — ABNORMAL LOW (ref 3.80–5.20)
RDW: 16.4 % — ABNORMAL HIGH (ref 11.5–14.5)
WBC: 1.9 10*3/uL — ABNORMAL LOW (ref 3.6–11.0)

## 2014-08-21 LAB — HEPATIC FUNCTION PANEL
ALT: 29 U/L (ref 14–54)
AST: 32 U/L (ref 15–41)
Albumin: 4.1 g/dL (ref 3.5–5.0)
Alkaline Phosphatase: 128 U/L — ABNORMAL HIGH (ref 38–126)
Bilirubin, Direct: 0.1 mg/dL (ref 0.1–0.5)
Indirect Bilirubin: 0.5 mg/dL (ref 0.3–0.9)
Total Bilirubin: 0.6 mg/dL (ref 0.3–1.2)
Total Protein: 7.9 g/dL (ref 6.5–8.1)

## 2014-08-21 LAB — CREATININE, SERUM
Creatinine, Ser: 0.65 mg/dL (ref 0.44–1.00)
GFR calc Af Amer: >60 mL/min (ref >60)

## 2014-08-22 ENCOUNTER — Ambulatory Visit
Admission: RE | Admit: 2014-08-22 | Discharge: 2014-08-22 | Disposition: A | Discharge status: Home / Self Care | Payer: BLUE CROSS/BLUE SHIELD | Source: Ambulatory Visit | Attending: Radiation Oncology | Admitting: Radiation Oncology

## 2014-08-22 ENCOUNTER — Ambulatory Visit: Payer: BLUE CROSS/BLUE SHIELD

## 2014-08-22 DIAGNOSIS — C50911 Malignant neoplasm of unspecified site of right female breast: Secondary | ICD-10-CM | POA: Diagnosis not present

## 2014-08-23 ENCOUNTER — Ambulatory Visit: Payer: BLUE CROSS/BLUE SHIELD

## 2014-08-23 ENCOUNTER — Ambulatory Visit
Admission: RE | Admit: 2014-08-23 | Discharge: 2014-08-23 | Disposition: A | Discharge status: Home / Self Care | Payer: BLUE CROSS/BLUE SHIELD | Source: Ambulatory Visit | Attending: Radiation Oncology | Admitting: Radiation Oncology

## 2014-08-23 DIAGNOSIS — C50911 Malignant neoplasm of unspecified site of right female breast: Secondary | ICD-10-CM | POA: Diagnosis not present

## 2014-08-24 ENCOUNTER — Ambulatory Visit
Admission: RE | Admit: 2014-08-24 | Discharge: 2014-08-24 | Disposition: A | Discharge status: Home / Self Care | Payer: BLUE CROSS/BLUE SHIELD | Source: Ambulatory Visit | Attending: Radiation Oncology | Admitting: Radiation Oncology

## 2014-08-24 ENCOUNTER — Ambulatory Visit: Payer: BLUE CROSS/BLUE SHIELD

## 2014-08-24 DIAGNOSIS — C50911 Malignant neoplasm of unspecified site of right female breast: Secondary | ICD-10-CM | POA: Diagnosis not present

## 2014-08-25 ENCOUNTER — Ambulatory Visit: Payer: BLUE CROSS/BLUE SHIELD

## 2014-08-25 ENCOUNTER — Ambulatory Visit
Admission: RE | Admit: 2014-08-25 | Discharge: 2014-08-25 | Disposition: A | Discharge status: Home / Self Care | Payer: BLUE CROSS/BLUE SHIELD | Source: Ambulatory Visit | Attending: Radiation Oncology | Admitting: Radiation Oncology

## 2014-08-25 DIAGNOSIS — C50911 Malignant neoplasm of unspecified site of right female breast: Secondary | ICD-10-CM | POA: Diagnosis not present

## 2014-08-29 ENCOUNTER — Inpatient Hospital Stay: Payer: BLUE CROSS/BLUE SHIELD | Attending: Internal Medicine

## 2014-08-29 ENCOUNTER — Inpatient Hospital Stay
Admission: RE | Admit: 2014-08-29 | Discharge: 2014-08-29 | Disposition: A | Discharge status: Home / Self Care | Payer: Self-pay | Source: Ambulatory Visit | Attending: Radiation Oncology | Admitting: Radiation Oncology

## 2014-08-29 ENCOUNTER — Ambulatory Visit
Admission: RE | Admit: 2014-08-29 | Discharge: 2014-08-29 | Disposition: A | Discharge status: Home / Self Care | Payer: BLUE CROSS/BLUE SHIELD | Source: Ambulatory Visit | Attending: Radiation Oncology | Admitting: Radiation Oncology

## 2014-08-29 ENCOUNTER — Ambulatory Visit: Payer: BLUE CROSS/BLUE SHIELD

## 2014-08-29 DIAGNOSIS — C50411 Malignant neoplasm of upper-outer quadrant of right female breast: Secondary | ICD-10-CM | POA: Insufficient documentation

## 2014-08-29 DIAGNOSIS — C787 Secondary malignant neoplasm of liver and intrahepatic bile duct: Secondary | ICD-10-CM | POA: Insufficient documentation

## 2014-08-29 DIAGNOSIS — C50911 Malignant neoplasm of unspecified site of right female breast: Secondary | ICD-10-CM | POA: Diagnosis not present

## 2014-08-29 DIAGNOSIS — Z170 Estrogen receptor positive status [ER+]: Secondary | ICD-10-CM | POA: Diagnosis not present

## 2014-08-29 DIAGNOSIS — Z79899 Other long term (current) drug therapy: Secondary | ICD-10-CM | POA: Diagnosis not present

## 2014-08-29 LAB — CBC WITH DIFFERENTIAL/PLATELET
Basophils Absolute: 0.1 10*3/uL (ref 0–0.1)
EOS ABS: 0 10*3/uL (ref 0–0.7)
Eosinophils Relative: 1 %
HCT: 33 % — ABNORMAL LOW (ref 35.0–47.0)
Hemoglobin: 10.8 g/dL — ABNORMAL LOW (ref 12.0–16.0)
Lymphocytes Relative: 18 %
Lymphs Abs: 0.3 10*3/uL — ABNORMAL LOW (ref 1.0–3.6)
MCH: 31.5 pg (ref 26.0–34.0)
MCHC: 32.7 g/dL (ref 32.0–36.0)
MCV: 96.1 fL (ref 80.0–100.0)
Monocytes Absolute: 0.2 10*3/uL (ref 0.2–0.9)
Monocytes Relative: 10 %
Neutro Abs: 1.1 10*3/uL — ABNORMAL LOW (ref 1.4–6.5)
PLATELETS: 147 10*3/uL — AB (ref 150–440)
RBC: 3.43 MIL/uL — ABNORMAL LOW (ref 3.80–5.20)
RDW: 17.7 % — AB (ref 11.5–14.5)
WBC: 1.6 10*3/uL — ABNORMAL LOW (ref 3.6–11.0)

## 2014-08-29 LAB — HEPATIC FUNCTION PANEL
ALBUMIN: 3.7 g/dL (ref 3.5–5.0)
ALT: 30 U/L (ref 14–54)
AST: 34 U/L (ref 15–41)
Alkaline Phosphatase: 135 U/L — ABNORMAL HIGH (ref 38–126)
BILIRUBIN TOTAL: 0.5 mg/dL (ref 0.3–1.2)
Total Protein: 7.4 g/dL (ref 6.5–8.1)

## 2014-08-29 LAB — CREATININE, SERUM
Creatinine, Ser: 0.76 mg/dL (ref 0.44–1.00)
GFR calc non Af Amer: >60 mL/min (ref >60)

## 2014-08-30 ENCOUNTER — Ambulatory Visit: Payer: BLUE CROSS/BLUE SHIELD

## 2014-08-30 ENCOUNTER — Ambulatory Visit
Admission: RE | Admit: 2014-08-30 | Discharge: 2014-08-30 | Disposition: A | Discharge status: Home / Self Care | Payer: BLUE CROSS/BLUE SHIELD | Source: Ambulatory Visit | Attending: Radiation Oncology | Admitting: Radiation Oncology

## 2014-08-30 DIAGNOSIS — C50911 Malignant neoplasm of unspecified site of right female breast: Secondary | ICD-10-CM | POA: Diagnosis not present

## 2014-08-31 ENCOUNTER — Ambulatory Visit: Payer: BLUE CROSS/BLUE SHIELD

## 2014-08-31 ENCOUNTER — Ambulatory Visit
Admission: RE | Admit: 2014-08-31 | Discharge: 2014-08-31 | Disposition: A | Discharge status: Home / Self Care | Payer: BLUE CROSS/BLUE SHIELD | Source: Ambulatory Visit | Attending: Radiation Oncology | Admitting: Radiation Oncology

## 2014-08-31 DIAGNOSIS — C50911 Malignant neoplasm of unspecified site of right female breast: Secondary | ICD-10-CM | POA: Diagnosis not present

## 2014-09-01 ENCOUNTER — Ambulatory Visit
Admission: RE | Admit: 2014-09-01 | Discharge: 2014-09-01 | Disposition: A | Discharge status: Home / Self Care | Payer: BLUE CROSS/BLUE SHIELD | Source: Ambulatory Visit | Attending: Radiation Oncology | Admitting: Radiation Oncology

## 2014-09-01 DIAGNOSIS — C50911 Malignant neoplasm of unspecified site of right female breast: Secondary | ICD-10-CM | POA: Diagnosis not present

## 2014-09-02 ENCOUNTER — Telehealth: Payer: Self-pay

## 2014-09-04 ENCOUNTER — Inpatient Hospital Stay: Payer: BLUE CROSS/BLUE SHIELD

## 2014-09-04 ENCOUNTER — Ambulatory Visit
Admission: RE | Admit: 2014-09-04 | Discharge: 2014-09-04 | Disposition: A | Discharge status: Home / Self Care | Payer: BLUE CROSS/BLUE SHIELD | Source: Ambulatory Visit | Attending: Radiation Oncology | Admitting: Radiation Oncology

## 2014-09-04 DIAGNOSIS — C50411 Malignant neoplasm of upper-outer quadrant of right female breast: Secondary | ICD-10-CM | POA: Diagnosis not present

## 2014-09-04 DIAGNOSIS — C50911 Malignant neoplasm of unspecified site of right female breast: Secondary | ICD-10-CM

## 2014-09-04 DIAGNOSIS — Z170 Estrogen receptor positive status [ER+]: Principal | ICD-10-CM

## 2014-09-04 LAB — CBC WITH DIFFERENTIAL/PLATELET
Basophils Absolute: 0.1 10*3/uL (ref 0–0.1)
Basophils Relative: 4 %
EOS ABS: 0 10*3/uL (ref 0–0.7)
EOS PCT: 1 %
HEMATOCRIT: 33.7 % — AB (ref 35.0–47.0)
Hemoglobin: 11 g/dL — ABNORMAL LOW (ref 12.0–16.0)
LYMPHS ABS: 0.3 10*3/uL — AB (ref 1.0–3.6)
Lymphocytes Relative: 17 %
MCH: 31.6 pg (ref 26.0–34.0)
MCHC: 32.6 g/dL (ref 32.0–36.0)
MCV: 96.8 fL (ref 80.0–100.0)
Monocytes Absolute: 0.4 10*3/uL (ref 0.2–0.9)
Monocytes Relative: 21 %
Neutro Abs: 1.1 10*3/uL — ABNORMAL LOW (ref 1.4–6.5)
Neutrophils Relative %: 57 %
Platelets: 202 10*3/uL (ref 150–440)
RBC: 3.48 MIL/uL — ABNORMAL LOW (ref 3.80–5.20)
RDW: 18.9 % — AB (ref 11.5–14.5)
WBC: 2 10*3/uL — AB (ref 3.6–11.0)

## 2014-09-04 LAB — HEPATIC FUNCTION PANEL
ALT: 31 U/L (ref 14–54)
AST: 42 U/L — AB (ref 15–41)
Albumin: 3.8 g/dL (ref 3.5–5.0)
Alkaline Phosphatase: 154 U/L — ABNORMAL HIGH (ref 38–126)
BILIRUBIN TOTAL: 0.4 mg/dL (ref 0.3–1.2)
Bilirubin, Direct: <0.1 mg/dL — ABNORMAL LOW (ref 0.1–0.5)
Total Protein: 7.7 g/dL (ref 6.5–8.1)

## 2014-09-04 LAB — CREATININE, SERUM: CREATININE: 0.63 mg/dL (ref 0.44–1.00)

## 2014-09-05 ENCOUNTER — Ambulatory Visit
Admission: RE | Admit: 2014-09-05 | Discharge: 2014-09-05 | Disposition: A | Discharge status: Home / Self Care | Payer: BLUE CROSS/BLUE SHIELD | Source: Ambulatory Visit | Attending: Radiation Oncology | Admitting: Radiation Oncology

## 2014-09-05 ENCOUNTER — Encounter: Payer: Self-pay | Admitting: *Deleted

## 2014-09-05 DIAGNOSIS — C50911 Malignant neoplasm of unspecified site of right female breast: Secondary | ICD-10-CM | POA: Diagnosis not present

## 2014-09-06 ENCOUNTER — Ambulatory Visit (INDEPENDENT_AMBULATORY_CARE_PROVIDER_SITE_OTHER): Payer: BLUE CROSS/BLUE SHIELD | Admitting: General Surgery

## 2014-09-06 ENCOUNTER — Encounter: Payer: Self-pay | Admitting: General Surgery

## 2014-09-06 VITALS — BP 128/76 | HR 74 | Resp 12 | Ht 66.0 in | Wt 175.0 lb

## 2014-09-06 DIAGNOSIS — C50911 Malignant neoplasm of unspecified site of right female breast: Secondary | ICD-10-CM

## 2014-09-06 DIAGNOSIS — C799 Secondary malignant neoplasm of unspecified site: Secondary | ICD-10-CM | POA: Diagnosis not present

## 2014-09-06 NOTE — Progress Notes (Addendum)
Patient ID: Jenna Pitts, female   DOB: 1950/11/21, 64 y.o.   MRN: [id number]  Chief Complaint  Patient presents with  . Other    breast cancer    HPI Jenna Pitts is a 64 y.o. female here for discussion of possible mastectomy. She is under the care of Dr Baruch Gouty and Ma Hillock  for stage 4 infiltrating ductal carcinoma of the right breast. She had a partial right breast mastectomy done at [facility] in January 2016. There was rapid reappearance of a mass at the site of her original resection with subsequent core biopsy showing recurrent ER positive, PR negative, HER-2/neu negative cancer. PET scan completed at that time showed evidence of metastatic disease in the liver confirmed on biopsy. Chemotherapy was stopped for initiation of low breast radiation and attempt to control the disease progression. This is been unsuccessful with the 2 cm mass in the upper-outer quadrant increasing to 6 cm during treatment. She is seen now for consideration of salvage surgery.  The patient is accompanied by her husband, Jenna Pitts, who was present for the interview and exam.    HPI  Past Medical History  Diagnosis Date  . Blood transfusion without reported diagnosis [place]  . Breast cancer     metastasized to liver    Past Surgical History  Procedure Laterality Date  . Breast lumpectomy with axillary lymph node biopsy    . Breast surgery Right 03/06/14    partial mastectomy  . Portacath placement  04/13/14  . Cesarean section  08/06/75, 12/11/78     X2    Family History  Problem Relation Age of Onset  . Cancer Mother     COLON  . Cancer Maternal Grandfather     COLON  . Melanoma Sister     Social History History  Substance Use Topics  . Smoking status: Never Smoker   . Smokeless tobacco: Never Used  . Alcohol Use: No    Allergies  Allergen Reactions  . Aspirin     RINGING EAR.  . Pseudoephedrine Palpitations    Current Outpatient Prescriptions  Medication Sig Dispense Refill   . Bioflavonoid Products (ESTER-C) TABS Take by mouth.    . Calcium-Magnesium-Zinc (CALCIUM-MAGNESUIUM-ZINC PO) Take by mouth.    Leslee Home 125 MG capsule   5  . letrozole (FEMARA) 2.5 MG tablet Take 1 tablet (2.5 mg total) by mouth daily. 30 tablet 11  . Multiple Vitamin (MULTIVITAMIN) capsule Take 1 capsule by mouth daily.    . promethazine (PHENERGAN) 25 MG tablet Take 25 mg by mouth every 4 (four) hours as needed.   2   No current facility-administered medications for this visit.    Review of Systems Review of Systems  Constitutional: Negative.   HENT: Negative.   Eyes: Negative.   Respiratory: Negative.   Cardiovascular: Negative.   Gastrointestinal: Negative.   Endocrine: Negative.   Genitourinary: Negative.   Allergic/Immunologic: Negative.   Neurological: Negative.   Hematological: Negative.   Psychiatric/Behavioral: Negative.     Blood pressure 128/76, pulse 74, resp. rate 12, height 5' 6"  (1.676 m), weight 175 lb (79.379 kg).  Physical Exam Physical Exam  Constitutional: She is oriented to person, place, and time. She appears well-developed and well-nourished.  HENT:  Mouth/Throat: Oropharynx is clear and moist. No oropharyngeal exudate.  Eyes: Conjunctivae are normal.  Neck: Neck supple.  Cardiovascular: Normal rate, regular rhythm and normal heart sounds.   Pulmonary/Chest: Effort normal and breath sounds normal. Left breast  exhibits no inverted nipple, no mass, no nipple discharge, no skin change and no tenderness.  Right breast 3 by 6 cm  Mass at axillary tail.  Abdominal: Soft. Bowel sounds are normal. She exhibits no distension. There is no tenderness.  Musculoskeletal: Normal range of motion.  Lymphadenopathy:    She has no cervical adenopathy.  Neurological: She is alert and oriented to person, place, and time.  Skin: Skin is warm and dry.  Psychiatric: She has a normal mood and affect. Her behavior is normal. Judgment and thought content normal.     Data Reviewed The pathology reports from the original core biopsy in December, surgical pathology from January 2016 at [facility] and liver biopsy were reviewed. Negative margins were achieved. Sentinel node was negative. ER +; PR negative, HER-2/neu negative.  Radiation and medical oncology notes were reviewed.  Imaging studies have been reviewed.  Most recent CBC showed a modest anemia and a TNC of 2000, ANC of 1000.  I spoke personally with both Dr. Baruch Gouty  and Dr. Ma Hillock guarding the patient's care.  Assessment    Failure of local control of right breast cancer with distant metastatic disease.    Plan     This patient has a rapidly progressive malignancy involving the right breast which is failed 2 cycles of chemotherapy as well as whole breast radiation. She has developed a single metastasis on therapy. Her functional status is excellent. Patient has had a second medical oncology opinion in [place]. She is presently considering options for management of her recurrent cancer.  We spent better and 50% of the one-hour visit reviewing where she is and what she might expect with additional surgical therapy. There is some slight evidence that resection of the primary tumor can't have an effect on the growth of metastatic disease in ER positive tumors. Considering that she is failed local control with first-line chemotherapy and whole breast radiation, surgical intervention seems appropriate in this young, active woman.  Options for surgical management were reviewed: 1) wide excision with preservation of the breast and formal axillary dissection versus 2) mastectomy with axillary dissection. I think in light of her rapid progression of disease and in spite of her negative sentinel node study optimal local/regional control of the is preeminent at this time.  The risks associated with surgery in a previously radiated field was reviewed.  The patient brought up the possibility of breast  reconstruction. I think that this would be an exceptionally poor choice considering the prolonged surgical time required, working in a radiated field and the likelihood that this would greatly interfere with any postsurgical adjuvant chemotherapy that might be offered.  The patient will hold her Ibrance at this time, anticipating surgery in the near future. This should have her [place] above 1500 and make infection less likely. (This was discussed with and approved by Dr. Ma Hillock).    Patient to notify the office how she would like to proceed.     PCP: Ria Bush Ref: Dr Adan Sis, [place] Gleason 09/06/2014, 8:58 PM

## 2014-09-06 NOTE — Patient Instructions (Signed)
Patient to notify the office how she would like to proceed.

## 2014-09-07 ENCOUNTER — Telehealth: Payer: Self-pay | Admitting: *Deleted

## 2014-09-07 NOTE — Telephone Encounter (Signed)
Patient called back to report she would like to have a right mastectomy.   Please let me know the codes and if patient will require a SLN biopsy so I can get arranged.

## 2014-09-08 NOTE — Telephone Encounter (Signed)
Spoke with patient about scheduling her surgery. Patient has been scheduled for surgery at [facility] on 09/15/14. She will pre admit by phone. Surgery instructions reviewed with patient and mailed to her. Patient is aware of date and instructions.

## 2014-09-08 NOTE — Telephone Encounter (Signed)
[id number]  Modified radical mastectomy.  No SLN bx.

## 2014-09-11 ENCOUNTER — Inpatient Hospital Stay: Payer: BLUE CROSS/BLUE SHIELD

## 2014-09-11 ENCOUNTER — Inpatient Hospital Stay (HOSPITAL_BASED_OUTPATIENT_CLINIC_OR_DEPARTMENT_OTHER): Payer: BLUE CROSS/BLUE SHIELD | Admitting: Internal Medicine

## 2014-09-11 VITALS — BP 119/75 | HR 91 | Temp 98.1°F | Resp 18 | Ht 66.0 in | Wt 175.9 lb

## 2014-09-11 DIAGNOSIS — C50411 Malignant neoplasm of upper-outer quadrant of right female breast: Secondary | ICD-10-CM | POA: Diagnosis not present

## 2014-09-11 DIAGNOSIS — Z79899 Other long term (current) drug therapy: Secondary | ICD-10-CM

## 2014-09-11 DIAGNOSIS — C787 Secondary malignant neoplasm of liver and intrahepatic bile duct: Secondary | ICD-10-CM

## 2014-09-11 DIAGNOSIS — C50911 Malignant neoplasm of unspecified site of right female breast: Secondary | ICD-10-CM

## 2014-09-11 DIAGNOSIS — Z170 Estrogen receptor positive status [ER+]: Secondary | ICD-10-CM | POA: Diagnosis not present

## 2014-09-11 LAB — CBC WITH DIFFERENTIAL/PLATELET
BASOS PCT: 5 %
Basophils Absolute: 0.1 10*3/uL (ref 0–0.1)
Eosinophils Absolute: 0 10*3/uL (ref 0–0.7)
Eosinophils Relative: 1 %
HCT: 33 % — ABNORMAL LOW (ref 35.0–47.0)
Hemoglobin: 10.7 g/dL — ABNORMAL LOW (ref 12.0–16.0)
LYMPHS PCT: 15 %
Lymphs Abs: 0.3 10*3/uL — ABNORMAL LOW (ref 1.0–3.6)
MCH: 31.3 pg (ref 26.0–34.0)
MCHC: 32.5 g/dL (ref 32.0–36.0)
MCV: 96.4 fL (ref 80.0–100.0)
MONO ABS: 0.3 10*3/uL (ref 0.2–0.9)
MONOS PCT: 12 %
NEUTROS ABS: 1.5 10*3/uL (ref 1.4–6.5)
Neutrophils Relative %: 67 %
Platelets: 310 10*3/uL (ref 150–440)
RBC: 3.42 MIL/uL — AB (ref 3.80–5.20)
RDW: 18.8 % — ABNORMAL HIGH (ref 11.5–14.5)
WBC: 2.2 10*3/uL — ABNORMAL LOW (ref 3.6–11.0)

## 2014-09-11 LAB — BASIC METABOLIC PANEL
Anion gap: 6 (ref 5–15)
BUN: 12 mg/dL (ref 6–20)
CALCIUM: 8.5 mg/dL — AB (ref 8.9–10.3)
CO2: 28 mmol/L (ref 22–32)
Chloride: 102 mmol/L (ref 101–111)
Creatinine, Ser: 0.79 mg/dL (ref 0.44–1.00)
GFR calc Af Amer: >60 mL/min (ref >60)
Glucose, Bld: 111 mg/dL — ABNORMAL HIGH (ref 65–99)
POTASSIUM: 4 mmol/L (ref 3.5–5.1)
SODIUM: 136 mmol/L (ref 135–145)

## 2014-09-11 LAB — HEPATIC FUNCTION PANEL
ALT: 35 U/L (ref 14–54)
AST: 50 U/L — ABNORMAL HIGH (ref 15–41)
Albumin: 3.6 g/dL (ref 3.5–5.0)
Alkaline Phosphatase: 175 U/L — ABNORMAL HIGH (ref 38–126)
BILIRUBIN TOTAL: 0.4 mg/dL (ref 0.3–1.2)
Bilirubin, Direct: <0.1 mg/dL — ABNORMAL LOW (ref 0.1–0.5)
TOTAL PROTEIN: 7.3 g/dL (ref 6.5–8.1)

## 2014-09-11 LAB — MAGNESIUM: MAGNESIUM: 2 mg/dL (ref 1.7–2.4)

## 2014-09-11 NOTE — Progress Notes (Signed)
Patient is having breast surgery on Friday 7/22. She states that she saw Dr. Bary Castilla last weekend and he instructed her to stop the Ibrance until after surgery because it has dropped her plts and her Hgb. Patient completed radiation treatments last Tuesday.

## 2014-09-12 ENCOUNTER — Ambulatory Visit
Admission: RE | Admit: 2014-09-12 | Discharge: 2014-09-12 | Disposition: A | Discharge status: Home / Self Care | Payer: BLUE CROSS/BLUE SHIELD | Source: Ambulatory Visit | Attending: Internal Medicine | Admitting: Internal Medicine

## 2014-09-12 ENCOUNTER — Other Ambulatory Visit: Payer: Self-pay | Admitting: General Surgery

## 2014-09-12 ENCOUNTER — Inpatient Hospital Stay: Admission: RE | Admit: 2014-09-12 | Payer: BLUE CROSS/BLUE SHIELD | Source: Ambulatory Visit

## 2014-09-12 ENCOUNTER — Encounter: Payer: Self-pay | Admitting: *Deleted

## 2014-09-12 ENCOUNTER — Other Ambulatory Visit: Payer: Self-pay | Admitting: Internal Medicine

## 2014-09-12 ENCOUNTER — Encounter: Payer: Self-pay | Admitting: General Surgery

## 2014-09-12 DIAGNOSIS — R748 Abnormal levels of other serum enzymes: Secondary | ICD-10-CM | POA: Diagnosis present

## 2014-09-12 DIAGNOSIS — R740 Nonspecific elevation of levels of transaminase and lactic acid dehydrogenase [LDH]: Secondary | ICD-10-CM

## 2014-09-12 DIAGNOSIS — R160 Hepatomegaly, not elsewhere classified: Secondary | ICD-10-CM | POA: Insufficient documentation

## 2014-09-12 DIAGNOSIS — C50911 Malignant neoplasm of unspecified site of right female breast: Secondary | ICD-10-CM

## 2014-09-12 DIAGNOSIS — R7989 Other specified abnormal findings of blood chemistry: Secondary | ICD-10-CM

## 2014-09-12 DIAGNOSIS — R945 Abnormal results of liver function studies: Secondary | ICD-10-CM

## 2014-09-12 DIAGNOSIS — R7401 Elevation of levels of liver transaminase levels: Secondary | ICD-10-CM

## 2014-09-12 NOTE — Patient Instructions (Signed)
  Your procedure is scheduled on: 09-15-14 Report to [facility] To find out your arrival time please call [phone] between 1PM - 3PM on 09-14-14  Remember: Instructions that are not followed completely may result in serious medical risk, up to and including death, or upon the discretion of your surgeon and anesthesiologist your surgery may need to be rescheduled.    _X___ 1. Do not eat food or drink liquids after midnight. No gum chewing or hard candies.     _X___ 2. No Alcohol for 24 hours before or after surgery.   ____ 3. Bring all medications with you on the day of surgery if instructed.    ____ 4. Notify your doctor if there is any change in your medical condition     (cold, fever, infections).     Do not wear jewelry, make-up, hairpins, clips or nail polish.  Do not wear lotions, powders, or perfumes. You may wear deodorant.  Do not shave 48 hours prior to surgery. Men may shave face and neck.  Do not bring valuables to the hospital.    [facility] is not responsible for any belongings or valuables.               Contacts, dentures or bridgework may not be worn into surgery.  Leave your suitcase in the car. After surgery it may be brought to your room.  For patients admitted to the hospital, discharge time is determined by your  treatment team.   Patients discharged the day of surgery will not be allowed to drive home.   Please read over the following fact sheets that you were given:     ____ Take these medicines the morning of surgery with A SIP OF WATER:    1. NONE  2.   3.   4.  5.  6.  ____ Fleet Enema (as directed)   ____ Use CHG Soap as directed  ____ Use inhalers on the day of surgery  ____ Stop metformin 2 days prior to surgery    ____ Take 1/2 of usual insulin dose the night before surgery and none on the morning of surgery.   ____ Stop Coumadin/Plavix/aspirin-N/A  ____ Stop Anti-inflammatories-NO NSAIDS OR ASA  PRODUCTS-TYLENOL OK   ____ Stop supplements until after surgery.    ____ Bring C-Pap to the hospital.

## 2014-09-12 NOTE — H&P (Signed)
Patient ID: Jenna Pitts, female DOB: [birth year], 64 y.o. MRN: [id number]  Chief Complaint  Patient presents with  . Other    breast cancer    HPI Jenna Pitts is a 64 y.o. female here for discussion of possible mastectomy. 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 She had a partial right breast mastectomy done at [facility] in January 2016. There was rapid reappearance of a mass at the site of her original resection with subsequent core biopsy showing recurrent ER positive, PR negative, HER-2/neu negative cancer. PET scan completed at that time showed evidence of metastatic disease in the liver confirmed on biopsy. Chemotherapy was stopped for initiation of low breast radiation and attempt to control the disease progression. This is been unsuccessful with the 2 cm mass in the upper-outer quadrant increasing to 6 cm during treatment. She is seen now for consideration of salvage surgery.  The patient is accompanied by her husband, [name], who was present for the interview and exam.   HPI  Past Medical History  Diagnosis Date  . Blood transfusion without reported diagnosis [place]  . Breast cancer     metastasized to liver    Past Surgical History  Procedure Laterality Date  . Breast lumpectomy with axillary lymph node biopsy    . Breast surgery Right 03/06/14    partial mastectomy  . Portacath placement  04/13/14  . Cesarean section  08/06/75, 12/11/78    X2    Family History  Problem Relation Age of Onset  . Cancer Mother     COLON  . Cancer Maternal Grandfather     COLON  . Melanoma Sister     Social History History  Substance Use Topics  . Smoking status: Never Smoker   . Smokeless tobacco: Never Used  . Alcohol Use: No    Allergies  Allergen Reactions  . Aspirin     RINGING EAR.  .  Pseudoephedrine Palpitations    Current Outpatient Prescriptions  Medication Sig Dispense Refill  . Bioflavonoid Products (ESTER-C) TABS Take by mouth.    . Calcium-Magnesium-Zinc (CALCIUM-MAGNESUIUM-ZINC PO) Take by mouth.    Leslee Home 125 MG capsule   5  . letrozole (FEMARA) 2.5 MG tablet Take 1 tablet (2.5 mg total) by mouth daily. 30 tablet 11  . Multiple Vitamin (MULTIVITAMIN) capsule Take 1 capsule by mouth daily.    . promethazine (PHENERGAN) 25 MG tablet Take 25 mg by mouth every 4 (four) hours as needed.   2   No current facility-administered medications for this visit.    Review of Systems Review of Systems  Constitutional: Negative.  HENT: Negative.  Eyes: Negative.  Respiratory: Negative.  Cardiovascular: Negative.  Gastrointestinal: Negative.  Endocrine: Negative.  Genitourinary: Negative.  Allergic/Immunologic: Negative.  Neurological: Negative.  Hematological: Negative.  Psychiatric/Behavioral: Negative.    Blood pressure 128/76, pulse 74, resp. rate 12, height 5' 6"  (1.676 m), weight 175 lb (79.379 kg).  Physical Exam Physical Exam  Constitutional: She is oriented to person, place, and time. She appears well-developed and well-nourished.  HENT:  Mouth/Throat: Oropharynx is clear and moist. No oropharyngeal exudate.  Eyes: Conjunctivae are normal.  Neck: Neck supple.  Cardiovascular: Normal rate, regular rhythm and normal heart sounds.  Pulmonary/Chest: Effort normal and breath sounds normal. Left breast exhibits no inverted nipple, no mass, no nipple discharge, no skin change and no tenderness.  Right breast 3 by  6 cm Mass at axillary tail.  Abdominal: Soft. Bowel sounds are normal. She exhibits no distension. There is no tenderness.  Musculoskeletal: Normal range of motion.  Lymphadenopathy:   She has no cervical adenopathy.  Neurological: She is alert and oriented to person, place, and time.   Skin: Skin is warm and dry.  Psychiatric: She has a normal mood and affect. Her behavior is normal. Judgment and thought content normal.    Data Reviewed The pathology reports from the original core biopsy in December, surgical pathology from January 2016 at [facility] and liver biopsy were reviewed. Negative margins were achieved. Sentinel node was negative. ER +; PR negative, HER-2/neu negative.  Radiation and medical oncology notes were reviewed.  Imaging studies have been reviewed.  Most recent CBC showed a modest anemia and a TNC of 2000, ANC of 1000.  I spoke personally with both Dr. Baruch Gouty and Dr. Ma Hillock guarding the patient's care.  Assessment    Failure of local control of right breast cancer with distant metastatic disease.    Plan    This patient has a rapidly progressive malignancy involving the right breast which is failed 2 cycles of chemotherapy as well as whole breast radiation. She has developed a single metastasis on therapy. Her functional status is excellent. Patient has had a second medical oncology opinion in [place]. She is presently considering options for management of her recurrent cancer.  We spent better and 50% of the one-hour visit reviewing where she is and what she might expect with additional surgical therapy. There is some slight evidence that resection of the primary tumor can't have an effect on the growth of metastatic disease in ER positive tumors. Considering that she is failed local control with first-line chemotherapy and whole breast radiation, surgical intervention seems appropriate in this young, active woman.  Options for surgical management were reviewed: 1) wide excision with preservation of the breast and formal axillary dissection versus 2) mastectomy with axillary dissection. I think in light of her rapid progression of disease and in spite of her negative sentinel node study optimal local/regional control of the is preeminent at this  time.  The risks associated with surgery in a previously radiated field was reviewed.  The patient brought up the possibility of breast reconstruction. I think that this would be an exceptionally poor choice considering the prolonged surgical time required, working in a radiated field and the likelihood that this would greatly interfere with any postsurgical adjuvant chemotherapy that might be offered.  The patient will hold her Ibrance at this time, anticipating surgery in the near future. This should have her [place] above 1500 and make infection less likely. (This was discussed with and approved by Dr. Ma Hillock).    Patient to notify the office how she would like to proceed.     PCP: Ria Bush Ref: Dr Adan Sis, Forest Gleason 09/06/2014, 8:58 PM

## 2014-09-15 ENCOUNTER — Ambulatory Visit
Admission: RE | Admit: 2014-09-15 | Discharge: 2014-09-15 | Disposition: A | Discharge status: Home / Self Care | Payer: BLUE CROSS/BLUE SHIELD | Source: Ambulatory Visit | Attending: General Surgery | Admitting: General Surgery

## 2014-09-15 ENCOUNTER — Other Ambulatory Visit: Payer: Self-pay | Admitting: Family Medicine

## 2014-09-15 ENCOUNTER — Ambulatory Visit: Payer: BLUE CROSS/BLUE SHIELD | Admitting: Anesthesiology

## 2014-09-15 ENCOUNTER — Encounter
Admission: RE | Disposition: A | Discharge status: Home / Self Care | Payer: Self-pay | Source: Ambulatory Visit | Attending: General Surgery

## 2014-09-15 ENCOUNTER — Encounter: Payer: Self-pay | Admitting: *Deleted

## 2014-09-15 DIAGNOSIS — Z808 Family history of malignant neoplasm of other organs or systems: Secondary | ICD-10-CM | POA: Diagnosis not present

## 2014-09-15 DIAGNOSIS — Z170 Estrogen receptor positive status [ER+]: Secondary | ICD-10-CM | POA: Diagnosis not present

## 2014-09-15 DIAGNOSIS — Z923 Personal history of irradiation: Secondary | ICD-10-CM | POA: Diagnosis not present

## 2014-09-15 DIAGNOSIS — C787 Secondary malignant neoplasm of liver and intrahepatic bile duct: Secondary | ICD-10-CM | POA: Insufficient documentation

## 2014-09-15 DIAGNOSIS — C50411 Malignant neoplasm of upper-outer quadrant of right female breast: Secondary | ICD-10-CM | POA: Diagnosis not present

## 2014-09-15 DIAGNOSIS — N63 Unspecified lump in breast: Secondary | ICD-10-CM | POA: Diagnosis present

## 2014-09-15 DIAGNOSIS — Z886 Allergy status to analgesic agent status: Secondary | ICD-10-CM | POA: Diagnosis not present

## 2014-09-15 DIAGNOSIS — D649 Anemia, unspecified: Secondary | ICD-10-CM | POA: Insufficient documentation

## 2014-09-15 DIAGNOSIS — Z9221 Personal history of antineoplastic chemotherapy: Secondary | ICD-10-CM | POA: Insufficient documentation

## 2014-09-15 DIAGNOSIS — C50911 Malignant neoplasm of unspecified site of right female breast: Secondary | ICD-10-CM | POA: Insufficient documentation

## 2014-09-15 DIAGNOSIS — Z79899 Other long term (current) drug therapy: Secondary | ICD-10-CM | POA: Diagnosis not present

## 2014-09-15 DIAGNOSIS — Z800 Family history of malignant neoplasm of digestive organs: Secondary | ICD-10-CM | POA: Insufficient documentation

## 2014-09-15 DIAGNOSIS — K219 Gastro-esophageal reflux disease without esophagitis: Secondary | ICD-10-CM | POA: Diagnosis not present

## 2014-09-15 DIAGNOSIS — Z888 Allergy status to other drugs, medicaments and biological substances status: Secondary | ICD-10-CM | POA: Diagnosis not present

## 2014-09-15 HISTORY — DX: Unspecified staphylococcus as the cause of diseases classified elsewhere: B95.8

## 2014-09-15 HISTORY — DX: Gastro-esophageal reflux disease without esophagitis: K21.9

## 2014-09-15 HISTORY — DX: Headache: R51

## 2014-09-15 HISTORY — DX: Headache, unspecified: R51.9

## 2014-09-15 HISTORY — DX: Anemia, unspecified: D64.9

## 2014-09-15 HISTORY — PX: MASTECTOMY MODIFIED RADICAL: SHX5962

## 2014-09-15 SURGERY — MASTECTOMY, MODIFIED RADICAL
Anesthesia: General | Laterality: Right

## 2014-09-15 MED ORDER — PROMETHAZINE HCL 25 MG/ML IJ SOLN
INTRAMUSCULAR | Status: AC
  Filled 2014-09-15: qty 1

## 2014-09-15 MED ORDER — SODIUM CHLORIDE 0.9 % IJ SOLN: 10.0000 mL | Freq: Once | INTRAMUSCULAR | Status: DC

## 2014-09-15 MED ORDER — DEXAMETHASONE SODIUM PHOSPHATE 4 MG/ML IJ SOLN
INTRAMUSCULAR | Status: DC | PRN
  Administered 2014-09-15: 10 mg via INTRAVENOUS

## 2014-09-15 MED ORDER — CEFAZOLIN SODIUM-DEXTROSE 2-3 GM-% IV SOLR
INTRAVENOUS | Status: AC
  Administered 2014-09-15: 2 g via INTRAVENOUS
  Filled 2014-09-15: qty 50

## 2014-09-15 MED ORDER — MIDAZOLAM HCL 2 MG/2ML IJ SOLN
INTRAMUSCULAR | Status: DC | PRN
  Administered 2014-09-15: 2 mg via INTRAVENOUS

## 2014-09-15 MED ORDER — HEPARIN SODIUM (PORCINE) 5000 UNIT/ML IJ SOLN
INTRAMUSCULAR | Status: AC
  Filled 2014-09-15: qty 1

## 2014-09-15 MED ORDER — ONDANSETRON HCL 4 MG/2ML IJ SOLN
4.0000 mg | Freq: Once | INTRAMUSCULAR | Status: AC | PRN
  Administered 2014-09-15: 4 mg via INTRAVENOUS

## 2014-09-15 MED ORDER — SODIUM CHLORIDE 0.9 % IJ SOLN
INTRAMUSCULAR | Status: AC
  Filled 2014-09-15: qty 3

## 2014-09-15 MED ORDER — KETOROLAC TROMETHAMINE 30 MG/ML IJ SOLN
INTRAMUSCULAR | Status: DC | PRN
  Administered 2014-09-15: 30 mg via INTRAVENOUS

## 2014-09-15 MED ORDER — HYDROMORPHONE HCL 1 MG/ML IJ SOLN
INTRAMUSCULAR | Status: AC
  Administered 2014-09-15: 0.5 mg via INTRAVENOUS
  Filled 2014-09-15: qty 1

## 2014-09-15 MED ORDER — ONDANSETRON HCL 4 MG/2ML IJ SOLN
INTRAMUSCULAR | Status: DC | PRN
  Administered 2014-09-15: 4 mg via INTRAVENOUS

## 2014-09-15 MED ORDER — ONDANSETRON HCL 4 MG/2ML IJ SOLN
INTRAMUSCULAR | Status: AC
  Administered 2014-09-15: 4 mg via INTRAVENOUS
  Filled 2014-09-15: qty 2

## 2014-09-15 MED ORDER — CEFAZOLIN SODIUM-DEXTROSE 2-3 GM-% IV SOLR
2.0000 g | INTRAVENOUS | Status: AC
  Administered 2014-09-15: 2 g via INTRAVENOUS

## 2014-09-15 MED ORDER — METHYLENE BLUE 1 % INJ SOLN
INTRAMUSCULAR | Status: AC
  Filled 2014-09-15: qty 10

## 2014-09-15 MED ORDER — HYDROMORPHONE HCL 1 MG/ML IJ SOLN
0.2500 mg | INTRAMUSCULAR | Status: DC | PRN
  Administered 2014-09-15: 0.5 mg via INTRAVENOUS
  Administered 2014-09-15 (×2): 0.25 mg via INTRAVENOUS

## 2014-09-15 MED ORDER — FENTANYL CITRATE (PF) 100 MCG/2ML IJ SOLN
INTRAMUSCULAR | Status: DC | PRN
  Administered 2014-09-15 (×6): 50 ug via INTRAVENOUS

## 2014-09-15 MED ORDER — FAMOTIDINE 20 MG PO TABS
20.0000 mg | ORAL_TABLET | Freq: Once | ORAL | Status: AC
  Administered 2014-09-15: 20 mg via ORAL

## 2014-09-15 MED ORDER — PROMETHAZINE HCL 25 MG/ML IJ SOLN
12.5000 mg | INTRAMUSCULAR | Status: DC | PRN
  Administered 2014-09-15: 12.5 mg via INTRAVENOUS

## 2014-09-15 MED ORDER — LIDOCAINE HCL (CARDIAC) 20 MG/ML IV SOLN
INTRAVENOUS | Status: DC | PRN
  Administered 2014-09-15: 80 mg via INTRAVENOUS

## 2014-09-15 MED ORDER — LACTATED RINGERS IV SOLN
INTRAVENOUS | Status: DC
  Administered 2014-09-15: 12:00:00 via INTRAVENOUS
  Administered 2014-09-15: 50 mL/h via INTRAVENOUS

## 2014-09-15 MED ORDER — HYDROCODONE-ACETAMINOPHEN 5-325 MG PO TABS: 1.0000 | ORAL_TABLET | ORAL | Status: DC | PRN

## 2014-09-15 MED ORDER — DEXMEDETOMIDINE HCL 200 MCG/2ML IV SOLN
INTRAVENOUS | Status: DC | PRN
  Administered 2014-09-15: 12 ug via INTRAVENOUS

## 2014-09-15 MED ORDER — PROPOFOL 10 MG/ML IV BOLUS
INTRAVENOUS | Status: DC | PRN
  Administered 2014-09-15: 150 mg via INTRAVENOUS

## 2014-09-15 MED ORDER — SODIUM CHLORIDE 0.9 % IJ SOLN
INTRAMUSCULAR | Status: AC
  Filled 2014-09-15: qty 10

## 2014-09-15 MED ORDER — FAMOTIDINE 20 MG PO TABS
ORAL_TABLET | ORAL | Status: AC
  Administered 2014-09-15: 20 mg via ORAL
  Filled 2014-09-15: qty 1

## 2014-09-15 MED ORDER — ACETAMINOPHEN 10 MG/ML IV SOLN
INTRAVENOUS | Status: AC
  Filled 2014-09-15: qty 100

## 2014-09-15 MED ORDER — ACETAMINOPHEN 10 MG/ML IV SOLN
INTRAVENOUS | Status: DC | PRN
  Administered 2014-09-15: 1000 mg via INTRAVENOUS

## 2014-09-15 SURGICAL SUPPLY — 48 items
APPLIER CLIP 11 MED OPEN (CLIP)
APPLIER CLIP 13 LRG OPEN (CLIP)
BANDAGE ELASTIC 6 CLIP NS LF (GAUZE/BANDAGES/DRESSINGS) ×3 IMPLANT
BNDG GAUZE 4.5X4.1 6PLY STRL (MISCELLANEOUS) ×3 IMPLANT
BULB RESERV EVAC DRAIN JP 100C (MISCELLANEOUS) ×3 IMPLANT
CANISTER SUCT 1200ML W/VALVE (MISCELLANEOUS) ×3 IMPLANT
CHLORAPREP W/TINT 26ML (MISCELLANEOUS) ×3 IMPLANT
CLIP APPLIE 11 MED OPEN (CLIP) IMPLANT
CLIP APPLIE 13 LRG OPEN (CLIP) IMPLANT
CLOSURE WOUND 1/2 X4 (GAUZE/BANDAGES/DRESSINGS) ×3
CNTNR SPEC 2.5X3XGRAD LEK (MISCELLANEOUS)
CONT SPEC 4OZ STER OR WHT (MISCELLANEOUS)
CONTAINER SPEC 2.5X3XGRAD LEK (MISCELLANEOUS) IMPLANT
DEVICE DUBIN SPECIMEN MAMMOGRA (MISCELLANEOUS) IMPLANT
DRAIN CHANNEL JP 15F RND 16 (MISCELLANEOUS) ×3 IMPLANT
DRAPE LAPAROTOMY TRNSV 106X77 (MISCELLANEOUS) ×3 IMPLANT
DRSG TELFA 3X8 NADH (GAUZE/BANDAGES/DRESSINGS) ×6 IMPLANT
ELECT CAUTERY BLADE TIP 2.5 (TIP) ×3
ELECTRODE CAUTERY BLDE TIP 2.5 (TIP) ×1 IMPLANT
GAUZE FLUFF 18X24 1PLY STRL (GAUZE/BANDAGES/DRESSINGS) ×3 IMPLANT
GAUZE SPONGE 4X4 12PLY STRL (GAUZE/BANDAGES/DRESSINGS) ×3 IMPLANT
GLOVE BIO SURGEON STRL SZ7.5 (GLOVE) ×6 IMPLANT
GLOVE INDICATOR 8.0 STRL GRN (GLOVE) ×6 IMPLANT
GOWN STRL REUS W/ TWL LRG LVL3 (GOWN DISPOSABLE) ×2 IMPLANT
GOWN STRL REUS W/TWL LRG LVL3 (GOWN DISPOSABLE) ×4
KIT RM TURNOVER STRD PROC AR (KITS) ×3 IMPLANT
LABEL OR SOLS (LABEL) ×3 IMPLANT
PACK BASIN MINOR ARMC (MISCELLANEOUS) ×3 IMPLANT
PAD GROUND ADULT SPLIT (MISCELLANEOUS) ×3 IMPLANT
PIN SAFETY STRL (MISCELLANEOUS) ×3 IMPLANT
SHEARS FOC LG CVD HARMONIC 17C (MISCELLANEOUS) ×3 IMPLANT
SLEVE PROBE SENORX GAMMA FIND (MISCELLANEOUS) IMPLANT
SPONGE LAP 18X18 5 PK (GAUZE/BANDAGES/DRESSINGS) ×3 IMPLANT
STRIP CLOSURE SKIN 1/2X4 (GAUZE/BANDAGES/DRESSINGS) ×6 IMPLANT
SUT ETHILON 3-0 FS-10 30 BLK (SUTURE) ×3
SUT SILK 0 (SUTURE)
SUT SILK 0 30XBRD TIE 6 (SUTURE) IMPLANT
SUT SILK 3 0 (SUTURE) ×2
SUT SILK 3-0 18XBRD TIE 12 (SUTURE) ×1 IMPLANT
SUT VIC AB 2-0 CT1 27 (SUTURE) ×4
SUT VIC AB 2-0 CT1 TAPERPNT 27 (SUTURE) ×2 IMPLANT
SUT VIC AB 2-0 CT2 27 (SUTURE) ×3 IMPLANT
SUT VIC AB 3-0 SH 27 (SUTURE) ×2
SUT VIC AB 3-0 SH 27X BRD (SUTURE) ×1 IMPLANT
SUT VICRYL+ 3-0 144IN (SUTURE) ×3 IMPLANT
SUTURE EHLN 3-0 FS-10 30 BLK (SUTURE) ×1 IMPLANT
SWABSTK COMLB BENZOIN TINCTURE (MISCELLANEOUS) ×3 IMPLANT
TAPE TRANSPORE STRL 2 31045 (GAUZE/BANDAGES/DRESSINGS) IMPLANT

## 2014-09-15 NOTE — OR Nursing (Signed)
Dr Bary Castilla in and instructed for second IV bag to infuse prior to discharge.

## 2014-09-15 NOTE — Anesthesia Postprocedure Evaluation (Signed)
  Anesthesia Post-op Note  Patient: Jenna Pitts  Procedure(s) Performed: Procedure(s): MASTECTOMY MODIFIED RADICAL (Right)  Anesthesia type:General  Patient location: PACU  Post pain: Pain level controlled  Post assessment: Post-op Vital signs reviewed, Patient's Cardiovascular Status Stable, Respiratory Function Stable, Patent Airway and No signs of Nausea or vomiting  Post vital signs: Reviewed and stable  Last Vitals:  Filed Vitals:   09/15/14 1408  BP:   Pulse: 86  Temp:   Resp: 19    Level of consciousness: awake, alert  and patient cooperative  Complications: No apparent anesthesia complications

## 2014-09-15 NOTE — Op Note (Signed)
Preoperative diagnosis: Right breast cancer, failed breast conservation.  Postoperative diagnosis: Same.  Operative procedure: Right modified radical mastectomy.  Operating surgeon: Hervey Ard, M.D.  Anesthesia: Gen. I LMA.  Estimated blood loss: 50 mL.  Clinical note this 64 year old woman underwent breast conservation January 2016 and shortly after removal of her small node negative tumor she had an in breast recurrence. She is pale radiation treatment and first round chemotherapy. Cell this mastectomy is planned. She received prior the procedure. SCD stockings for DVT prevention were in place.  Operative note: With the patient under adequate general anesthesia the breast chest and axilla was prepped with chlor prep and drape. An oblique curvilinear incision was made to encompass the area of marked palpable disease. Skin was incised sharply and the remaining dissection completed with electrocautery. Moderate skin edema and consistent with recently completed whole breast radiation. The flaps were elevated as follows: Superiorly to the clavicle, medially to the sternum, inferiorly to the rectus fascia and laterally to the serratus muscle. The breast was elevated off the underlying pectoralis muscle taking the fascia of that muscle with the specimen. There was no evidence of invasion of the muscle. The axillary envelope was opened and making use of Harmonic scalpel formal axillary dissection of level I and 2 nodes were completed. A vein as well as the long thoracic nerve of Bell and the thoracodorsal nerve artery and vein bundle were identified and protected. Hemostasis was excellent. The wound was irrigated with sterile water. The flaps were approximated with running 2-0 Vicryls sutures in 2 segments. Benzoin and Steri-Strips were applied. Fluff gauze, Kerlix and a strap was applied. A Blake drain placed prior to wound closure was placed to self suction. This had been anchored into position with  3-0 nylon suture.  The patient tolerated the procedure well and was taken to the recovery room in stable condition.

## 2014-09-15 NOTE — OR Nursing (Signed)
Left chest portacath accessed.  When pulled back, positive blood return visible.  Flushes easily also.  Lactated Ringers primed through line and flowing well.  Patient comfortable throughout.  Sterile procedure used as indicated in policies.

## 2014-09-15 NOTE — H&P (Signed)
No change in cardiopulmonary status. Has decided to go with salvage mastectomy. For modified radical mastectomy today.

## 2014-09-15 NOTE — Discharge Instructions (Signed)

## 2014-09-15 NOTE — Transfer of Care (Signed)
Immediate Anesthesia Transfer of Care Note  Patient: Jenna Pitts  Procedure(s) Performed: Procedure(s): MASTECTOMY MODIFIED RADICAL (Right)  Patient Location: PACU  Anesthesia Type:General  Level of Consciousness: awake and alert   Airway & Oxygen Therapy: Patient Spontanous Breathing and Patient connected to face mask oxygen  Post-op Assessment: Report given to RN and Post -op Vital signs reviewed and stable  Post vital signs: Reviewed and stable  Last Vitals:  Filed Vitals:   09/15/14 1025  BP: 136/74  Pulse: 103  Temp: 36.3 C  Resp: 16    Complications: No apparent anesthesia complications

## 2014-09-15 NOTE — Anesthesia Preprocedure Evaluation (Signed)
Anesthesia Evaluation  Patient identified by MRN, date of birth, ID band Patient awake    Reviewed: Allergy & Precautions, NPO status , Patient's Chart, lab work & pertinent test results  Airway Mallampati: I  TM Distance: >3 FB Neck ROM: Limited    Dental  (+) Teeth Intact   Pulmonary          Cardiovascular negative cardio ROS Normal cardiovascular exam    Neuro/Psych    GI/Hepatic GERD-  Medicated and Controlled,  Endo/Other    Renal/GU      Musculoskeletal   Abdominal (+)  Abdomen: soft and tender.  Has some tenderness on right which is new--she attributes this to gas.  Peds  Hematology  (+) anemia ,   Anesthesia Other Findings   Reproductive/Obstetrics                             Anesthesia Physical Anesthesia Plan  ASA: III  Anesthesia Plan: General   Post-op Pain Management:    Induction: Intravenous  Airway Management Planned: LMA  Additional Equipment:   Intra-op Plan:   Post-operative Plan: Extubation in OR  Informed Consent: I have reviewed the patients History and Physical, chart, labs and discussed the procedure including the risks, benefits and alternatives for the proposed anesthesia with the patient or authorized representative who has indicated his/her understanding and acceptance.     Plan Discussed with:   Anesthesia Plan Comments:         Anesthesia Quick Evaluation

## 2014-09-18 ENCOUNTER — Encounter: Payer: Self-pay | Admitting: General Surgery

## 2014-09-18 ENCOUNTER — Ambulatory Visit (INDEPENDENT_AMBULATORY_CARE_PROVIDER_SITE_OTHER): Payer: BLUE CROSS/BLUE SHIELD | Admitting: General Surgery

## 2014-09-18 ENCOUNTER — Telehealth: Payer: Self-pay | Admitting: *Deleted

## 2014-09-18 VITALS — BP 132/70 | HR 76 | Resp 14 | Ht 61.0 in | Wt 162.0 lb

## 2014-09-18 DIAGNOSIS — C799 Secondary malignant neoplasm of unspecified site: Secondary | ICD-10-CM

## 2014-09-18 DIAGNOSIS — C50911 Malignant neoplasm of unspecified site of right female breast: Secondary | ICD-10-CM

## 2014-09-18 NOTE — Progress Notes (Addendum)
Patient ID: Jenna Pitts, female   DOB: Jul 02, 1950, 64 y.o.   MRN: [id number]  Chief Complaint  Patient presents with  . Routine Post Op    right mastectomy    HPI [name] is a 64 y.o. female here today for her post op right mastectomy done on 09/15/14. Patient states she is doing well.   HPI  Past Medical History  Diagnosis Date  . Blood transfusion without reported diagnosis [place]  . GERD (gastroesophageal reflux disease)   . Staph infection 1986    AFTER C/S 39 YEARS AGO  . Headache     used to have migraines but no longer  . Breast cancer     metastasized to liver  . Anemia     blood transfusion 40 years ago    Past Surgical History  Procedure Laterality Date  . Breast lumpectomy with axillary lymph node biopsy    . Portacath placement  04/13/14  . Cesarean section  08/06/75, 12/11/78     X2  . Breast surgery Right 03/06/14    partial mastectomy  . Mastectomy modified radical Right 09/15/2014    Procedure: MASTECTOMY MODIFIED RADICAL;  Surgeon: Robert Bellow, MD;  Location: [facility] ORS;  Service: General;  Laterality: Right;    Family History  Problem Relation Age of Onset  . Cancer Mother     COLON  . Cancer Maternal Grandfather     COLON  . Melanoma Sister     Social History History  Substance Use Topics  . Smoking status: Never Smoker   . Smokeless tobacco: Never Used  . Alcohol Use: No    Allergies  Allergen Reactions  . Aspirin     RINGING EAR.  . Band-Aid Plus Antibiotic [Bacitracin-Polymyxin B] Rash    WHEN APPLIED TO CHEST AREA  . Pseudoephedrine Palpitations    Current Outpatient Prescriptions  Medication Sig Dispense Refill  . Bioflavonoid Products (ESTER-C) TABS Take by mouth.    . Calcium-Magnesium-Zinc (CALCIUM-MAGNESUIUM-ZINC PO) Take by mouth.    Marland Kitchen HYDROcodone-acetaminophen (NORCO) 5-325 MG per tablet Take 1-2 tablets by mouth every 4 (four) hours as needed for moderate pain. 30 tablet 0  . letrozole (FEMARA)  2.5 MG tablet Take 1 tablet (2.5 mg total) by mouth daily. 30 tablet 11  . Multiple Vitamin (MULTIVITAMIN) capsule Take 1 capsule by mouth daily.    . promethazine (PHENERGAN) 25 MG tablet Take 25 mg by mouth every 4 (four) hours as needed.   2   No current facility-administered medications for this visit.    Review of Systems Review of Systems  Constitutional: Negative.   Respiratory: Negative.   Cardiovascular: Negative.     Blood pressure 132/70, pulse 76, resp. rate 14, height 5\' 1"  (1.549 m), weight 162 lb (73.483 kg).  Physical Exam Physical Exam  Constitutional: She appears well-developed and well-nourished.  Eyes: No scleral icterus.  Pulmonary/Chest:    Lymphadenopathy:    She has no cervical adenopathy.  Skin: Skin is warm and dry.    Data Reviewed Pathology received after the patient left. 3 of 11 nodes positive for metastatic disease. Greater than 5 cm tumor. Recently completed ultrasound showed the right hepatic lobe mass has increased from 2 cm to 6 cm. A left lobe lesion is now identified.  Assessment    Doing well post salvage mastectomy with axillary dissection.    Plan     The patient will continue to work with gentle stretching exercises  to minimize stiffness in the shoulder. She may drive when she is pain-free.      A new prescription for Norco 5/325, #30 with the inscription 1-2 by mouth every 4 hours when necessary for pain was provided.  Arrangements are for follow-up with Dr. Oliva Bustard tomorrow and medical oncology and Dr. Beverly Gust absence. Patient to return in one week. PCP:  Janeece Agee, Forest Gleason 09/19/2014, 5:06 PM

## 2014-09-18 NOTE — Patient Instructions (Signed)
Patient to return in one week. 

## 2014-09-18 NOTE — Telephone Encounter (Signed)
Patient has a question about how her bandage is wrapped that was done this morning in her nurse visit

## 2014-09-18 NOTE — Telephone Encounter (Signed)
Patient states that she feels like the skin above the dressing may be swelling. She is not sure if the dressing is too tight or not. Patient advise to return so we may look at her dressing. She is amendable to this.

## 2014-09-19 ENCOUNTER — Ambulatory Visit (INDEPENDENT_AMBULATORY_CARE_PROVIDER_SITE_OTHER): Payer: BLUE CROSS/BLUE SHIELD | Admitting: *Deleted

## 2014-09-19 ENCOUNTER — Inpatient Hospital Stay (HOSPITAL_BASED_OUTPATIENT_CLINIC_OR_DEPARTMENT_OTHER): Payer: BLUE CROSS/BLUE SHIELD | Admitting: Oncology

## 2014-09-19 VITALS — BP 130/86 | HR 99 | Temp 98.5°F | Wt 173.9 lb

## 2014-09-19 DIAGNOSIS — Z170 Estrogen receptor positive status [ER+]: Secondary | ICD-10-CM | POA: Diagnosis not present

## 2014-09-19 DIAGNOSIS — C50911 Malignant neoplasm of unspecified site of right female breast: Secondary | ICD-10-CM

## 2014-09-19 DIAGNOSIS — C799 Secondary malignant neoplasm of unspecified site: Secondary | ICD-10-CM

## 2014-09-19 DIAGNOSIS — Z79899 Other long term (current) drug therapy: Secondary | ICD-10-CM | POA: Diagnosis not present

## 2014-09-19 DIAGNOSIS — C787 Secondary malignant neoplasm of liver and intrahepatic bile duct: Secondary | ICD-10-CM

## 2014-09-19 DIAGNOSIS — C50411 Malignant neoplasm of upper-outer quadrant of right female breast: Secondary | ICD-10-CM | POA: Diagnosis not present

## 2014-09-19 NOTE — Progress Notes (Signed)
Patient came in today for a wound check/rewrapped.  The wound is clean, she does have some redness that has developed. She states that the drainage is less. Drain milked and functioning well. Follow up as scheduled.

## 2014-09-19 NOTE — Progress Notes (Signed)
Patient does not have living will.  Never smoked.  Patient has right mastectomy on Friday.

## 2014-09-19 NOTE — Patient Instructions (Signed)
The patient is aware to call back for any questions or concerns.  

## 2014-09-20 ENCOUNTER — Ambulatory Visit (INDEPENDENT_AMBULATORY_CARE_PROVIDER_SITE_OTHER): Payer: BLUE CROSS/BLUE SHIELD | Admitting: General Surgery

## 2014-09-20 ENCOUNTER — Encounter: Payer: Self-pay | Admitting: General Surgery

## 2014-09-20 VITALS — BP 128/74 | HR 76 | Temp 99.5°F | Resp 12 | Ht 61.0 in | Wt 173.0 lb

## 2014-09-20 DIAGNOSIS — C799 Secondary malignant neoplasm of unspecified site: Secondary | ICD-10-CM

## 2014-09-20 DIAGNOSIS — L0390 Cellulitis, unspecified: Secondary | ICD-10-CM

## 2014-09-20 DIAGNOSIS — C50911 Malignant neoplasm of unspecified site of right female breast: Secondary | ICD-10-CM

## 2014-09-20 NOTE — Patient Instructions (Signed)
Patient to return in one day nurse

## 2014-09-20 NOTE — Progress Notes (Signed)
Patient ID: Jenna Pitts, female   DOB: 11-15-1950, 64 y.o.   MRN: [id number]  Chief Complaint  Patient presents with  . Follow-up    right mastectomy    HPI Jenna Pitts is a 64 y.o. female here today folowing up with redness and fever. She reports the redness began the afternoon of July 25. When her temperature reached 101 yesterday she called the office, and Rochel Brome, M.D.  placed her on Augmentin 875 mg. She states her fever broke around 5 am days bar today and she has had no further fevers. The patient reports that drain volume had decreased and she felt the upper flap of the mastectomy was "squishy". She is accompanied today by her husband.   HPI  Past Medical History  Diagnosis Date  . Blood transfusion without reported diagnosis [place]  . GERD (gastroesophageal reflux disease)   . Staph infection 1986    AFTER C/S 39 YEARS AGO  . Headache     used to have migraines but no longer  . Breast cancer     metastasized to liver  . Anemia     blood transfusion 40 years ago    Past Surgical History  Procedure Laterality Date  . Breast lumpectomy with axillary lymph node biopsy    . Portacath placement  04/13/14  . Cesarean section  08/06/75, 12/11/78     X2  . Breast surgery Right 03/06/14    partial mastectomy  . Mastectomy modified radical Right 09/15/2014    Procedure: MASTECTOMY MODIFIED RADICAL;  Surgeon: Robert Bellow, MD;  Location: [facility] ORS;  Service: General;  Laterality: Right;    Family History  Problem Relation Age of Onset  . Cancer Mother     COLON  . Cancer Maternal Grandfather     COLON  . Melanoma Sister     Social History History  Substance Use Topics  . Smoking status: Never Smoker   . Smokeless tobacco: Never Used  . Alcohol Use: No    Allergies  Allergen Reactions  . Aspirin     RINGING EAR.  . Band-Aid Plus Antibiotic [Bacitracin-Polymyxin B] Rash    WHEN APPLIED TO CHEST AREA  . Pseudoephedrine Palpitations     Current Outpatient Prescriptions  Medication Sig Dispense Refill  . amoxicillin (AMOXIL) 875 MG tablet Take 875 mg by mouth 2 (two) times daily.    Marland Kitchen Bioflavonoid Products (ESTER-C) TABS Take by mouth.    . Calcium-Magnesium-Zinc (CALCIUM-MAGNESUIUM-ZINC PO) Take by mouth.    Marland Kitchen HYDROcodone-acetaminophen (NORCO) 5-325 MG per tablet Take 1-2 tablets by mouth every 4 (four) hours as needed for moderate pain. 30 tablet 0  . letrozole (FEMARA) 2.5 MG tablet Take 1 tablet (2.5 mg total) by mouth daily. 30 tablet 11  . Multiple Vitamin (MULTIVITAMIN) capsule Take 1 capsule by mouth daily.    . promethazine (PHENERGAN) 25 MG tablet Take 25 mg by mouth every 4 (four) hours as needed.   2   No current facility-administered medications for this visit.    Review of Systems Review of Systems  Constitutional: Positive for fever.  Respiratory: Negative.   Cardiovascular: Negative.     Blood pressure 128/74, pulse 76, temperature 99.5 F (37.5 C), temperature source Oral, resp. rate 12, height 5' 1"  (1.549 m), weight 173 lb (78.472 kg).  Physical Exam Physical Exam  Constitutional: She is oriented to person, place, and time. She appears well-developed and well-nourished.  Cardiovascular: Normal rate, regular rhythm  and normal heart sounds.   Pulmonary/Chest: Effort normal and breath sounds normal.    Neurological: She is alert and oriented to person, place, and time.  Skin: Skin is warm and dry.    Data Reviewed 3 of 12 nodes positive. T3, N1a. Recently completed abdominal ultrasound showed increase in size of the right lobe of liver metastasis from 2 cm to 6 cm.  Assessment    Cellulitis right chest wall, infection versus surgery and radiation field.    Plan    Today's exam did not show any loculated fluid under the mastectomy flaps. The drain was cleared and some slightly turbid fluid noted. A culture was obtained. She'll continue her Augmentin. She was rewrapped with a  compressive wrap and will return tomorrow for nursing exam.  She has met with Dr. Oliva Bustard for medical oncology and Dr. Beverly Gust absence. Plans are to reinstitute chemotherapy in the near future.      PCP:  Janeece Agee, Forest Gleason 09/21/2014, 7:50 PM

## 2014-09-21 ENCOUNTER — Encounter: Payer: Self-pay | Admitting: *Deleted

## 2014-09-21 ENCOUNTER — Ambulatory Visit (INDEPENDENT_AMBULATORY_CARE_PROVIDER_SITE_OTHER): Payer: BLUE CROSS/BLUE SHIELD | Admitting: *Deleted

## 2014-09-21 VITALS — Ht 66.0 in | Wt 173.0 lb

## 2014-09-21 DIAGNOSIS — C50911 Malignant neoplasm of unspecified site of right female breast: Secondary | ICD-10-CM

## 2014-09-21 DIAGNOSIS — L0390 Cellulitis, unspecified: Secondary | ICD-10-CM | POA: Insufficient documentation

## 2014-09-21 DIAGNOSIS — C799 Secondary malignant neoplasm of unspecified site: Secondary | ICD-10-CM

## 2014-09-21 NOTE — Progress Notes (Signed)
Patient came in today for a wound check right mastectomy. She states temp last night was 99.5. The redness is much improved and even inside the margins now. Follow up Monday.

## 2014-09-21 NOTE — Patient Instructions (Signed)
Patient to return as scheduled.  

## 2014-09-22 LAB — SURGICAL PATHOLOGY

## 2014-09-24 ENCOUNTER — Encounter: Payer: Self-pay | Admitting: Oncology

## 2014-09-24 NOTE — Progress Notes (Signed)
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 She was recalled for a possible abnormality in the right breast and additional views with ultrasonography led to biopsy 02/22/2014, which showed (MS [phone]) and invasive ductal carcinoma, grade 3, estrogen receptor 85% positive, progesterone receptor negative, with HER-2 equivocal, the quantitative score being 2+. Fish was performed and showed a signals ratio of 1.2, number per cell being 3.6.  The patient then had an Oncotype DX since with the surprisingly high score of 53. This predicts a high risk of recurrence outside the breast with antiestrogen treatment only. It also predicts significant benefit from chemotherapy.  Accordingly the patient was started on cyclophosphamide and docetaxel. She tells me that at the start of chemotherapy she already had a "red spots" in her right axilla. This was interpreted as a possible infection or inflammation, but as it did not respond to antibiotic treatment, biopsy of the right axillary area 05/31/2014 showed (Craig) invasive ductal carcinoma, grade 3, estrogen receptor again positive at approximately 60%, progesterone receptor again negative, HER-2 again equivocal, with Fish repeated showing a again a signals ratio of 1.2 and the number per cell of 3.3.  At that point the patient was staged with a PET  scan obtained 06/12/2014. It showed a 2.4 cm hypodensity in the right lobe of the liver as well as focal update in the right lateral breast (2.6 cm, and the right axilla (9 mm) there was no other area suspicious for malignancy.  The patient underwent biopsy of the liver lesion at [facility] 07/05/2014. Samples from that procedure were also sent for [facility]. Results are pending. The patient was started on letrozole 06/28/2014 and has Palbociclib in hand, expecting to begin those treatments next week.  Her subsequent history is as detailed below  INTERVAL HISTORY: Patient recently had a mastectomy done on last Friday.  I received a phone call from Dr. Bary Castilla.Marland Kitchen Ultrasound of the liver shows progressive disease.  Ultrasound in comparison to the [facility] has increased in size. Patient is off IBRANCE at present time it appears that patient is progressing on IBRANCE and letrozole therapy.  Patient and family here to discuss the results and the further options of treatment  REVIEW OF SYSTEMS: GENERAL:  Feels good.  Active.  No fevers, sweats or weight loss. PERFORMANCE STATUS (ECOG): 0 HEENT:  No visual changes, runny nose, sore throat, mouth sores or tenderness. Lungs: No shortness of breath or cough.  No hemoptysis. Cardiac:  No chest pain, palpitations, orthopnea, or PND. GI:  No nausea, vomiting, diarrhea, constipation, melena or hematochezia. GU:  No urgency, frequency, dysuria, or hematuria. Musculoskeletal:  No back pain.  No joint pain.  No muscle tenderness. Extremities:  No pain or swelling. Skin:  No rashes or skin changes. Neuro:  No headache, numbness or weakness, balance or coordination issues. Endocrine:  No diabetes, thyroid issues, hot flashes or night sweats. Psych:  No mood changes, depression or anxiety. Pain:  No  focal pain. Review of systems:  All other systems reviewed and found to be negative.  PAST MEDICAL HISTORY: Past Medical History  Diagnosis Date  . Blood transfusion  without reported diagnosis [place]  . GERD (gastroesophageal reflux disease)   . Staph infection 1986    AFTER C/S 39 YEARS AGO  . Headache     used to have migraines but no longer  . Breast cancer     metastasized to liver  . Anemia     blood transfusion 40 years ago    PAST SURGICAL HISTORY: Past Surgical History  Procedure Laterality Date  . Breast lumpectomy with axillary lymph node biopsy    . Portacath placement  04/13/14  . Cesarean section  08/06/75, 12/11/78     X2  . Breast surgery Right 03/06/14    partial mastectomy  . Mastectomy modified radical Right 09/15/2014    Procedure: MASTECTOMY MODIFIED RADICAL;  Surgeon: Robert Bellow, MD;  Location: [facility] ORS;  Service: General;  Laterality: Right;    FAMILY HISTORY Family History  Problem Relation Age of Onset  . Cancer Mother     COLON  . Cancer Maternal Grandfather     COLON  . Melanoma Sister    the patient knows little about her father, who left the family when she was age 21. The patient's mother died at age 19 from colon cancer diagnosed when she was 45. The patient's maternal grandfather also had colon cancer diagnosed in his 17s. The patient has 2 brothers, 2 sisters. One sister has a history of melanoma. There is no history of breast or ovarian cancer in the family.  GYNECOLOGIC HISTORY:  No LMP recorded. Patient is postmenopausal. Menarche age 64, first live birth age 64. The patient is GX P2. She went through menopause in her early 45s. She did not take hormone replacement. 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 Her husband Jenna Pitts, was a Music therapist now runs the [employer] overlay service. Their daughter Jenna Pitts is a Development worker, community in [place]. Son Jenna Pitts lives in [place] or he works as a  Music therapist. The patient has 5 grandchildren. She attends an independent [employer]:  Patient does not have any living will or healthcare power of attorney.  Information was given .  Available resources had been discussed.  We will follow-up on subsequent appointments regarding this issue   HEALTH MAINTENANCE: History  Substance Use Topics  . Smoking status: Never Smoker   . Smokeless tobacco: Never Used  . Alcohol Use: No      Allergies  Allergen Reactions  . Aspirin     RINGING EAR.  . Band-Aid Plus Antibiotic [Bacitracin-Polymyxin B] Rash    WHEN APPLIED TO CHEST AREA  . Pseudoephedrine Palpitations    Current Outpatient Prescriptions  Medication Sig Dispense Refill  . Bioflavonoid Products (ESTER-C) TABS Take by mouth.    . Calcium-Magnesium-Zinc (CALCIUM-MAGNESUIUM-ZINC PO) Take by mouth.    Marland Kitchen HYDROcodone-acetaminophen (NORCO) 5-325 MG per tablet Take 1-2 tablets by mouth every 4 (four) hours as needed for moderate pain. 30 tablet 0  . letrozole (FEMARA) 2.5 MG tablet Take 1 tablet (2.5 mg total) by mouth daily. 30 tablet 11  . Multiple Vitamin (MULTIVITAMIN) capsule Take 1 capsule by  mouth daily.    . promethazine (PHENERGAN) 25 MG tablet Take 25 mg by mouth every 4 (four) hours as needed.   2  . amoxicillin (AMOXIL) 875 MG tablet Take 875 mg by mouth 2 (two) times daily.     No current facility-administered medications for this visit.    OBJECTIVE: Middle-aged white woman in no acute distress Filed Vitals:   09/19/14 1129  BP: 130/86  Pulse: 99  Temp: 98.5 F (36.9 C)     Body mass index is 32.88 kg/(m^2).    ECOG FS:1 - Symptomatic but completely ambulatory  Ocular: Sclerae unicteric, pupils equal, round and reactive to light, EOMs intact Ear-nose-throat: Oropharynx clear, dentition in good repair Lymphatic: No cervical or supraclavicular adenopathy Lungs no rales or rhonchi, good excursion bilaterally Heart regular rate and  rhythm, no murmur appreciated Abd soft, nontender, positive bowel sounds MSK no focal spinal tenderness, no right upper extremity edema Neuro: non-focal, well-oriented, appropriate affect Breasts: Status post mastectomy.  Wound is still healing.   LAB RESULTS:  CMP     Component Value Date/Time   NA 136 09/11/2014 1518   NA 138 06/16/2014 0918   K 4.0 09/11/2014 1518   K 3.8 06/16/2014 0918   CL 102 09/11/2014 1518   CL 104 06/16/2014 0918   CO2 28 09/11/2014 1518   CO2 26 06/16/2014 0918   GLUCOSE 111* 09/11/2014 1518   GLUCOSE 120* 06/16/2014 0918   BUN 12 09/11/2014 1518   BUN 10 06/16/2014 0918   CREATININE 0.79 09/11/2014 1518   CREATININE 0.60 06/16/2014 0918   CREATININE 0.68 01/12/2014 0842   CALCIUM 8.5* 09/11/2014 1518   CALCIUM 9.3 06/16/2014 0918   PROT 7.3 09/11/2014 1518   PROT 7.4 06/16/2014 0918   ALBUMIN 3.6 09/11/2014 1518   ALBUMIN 4.0 06/16/2014 0918   AST 50* 09/11/2014 1518   AST 25 06/16/2014 0918   ALT 35 09/11/2014 1518   ALT 17 06/16/2014 0918   ALKPHOS 175* 09/11/2014 1518   ALKPHOS 84 06/16/2014 0918   BILITOT 0.4 09/11/2014 1518   BILITOT 1.0 06/16/2014 0918   GFRNONAA >60 09/11/2014 1518   GFRNONAA >60 06/16/2014 0918   GFRAA >60 09/11/2014 1518   GFRAA >60 06/16/2014 0918    INo results found for: SPEP, UPEP  Lab Results  Component Value Date   WBC 2.2* 09/11/2014   NEUTROABS 1.5 09/11/2014   HGB 10.7* 09/11/2014   HCT 33.0* 09/11/2014   MCV 96.4 09/11/2014   PLT 310 09/11/2014      Chemistry      Component Value Date/Time   NA 136 09/11/2014 1518   NA 138 06/16/2014 0918   K 4.0 09/11/2014 1518   K 3.8 06/16/2014 0918   CL 102 09/11/2014 1518   CL 104 06/16/2014 0918   CO2 28 09/11/2014 1518   CO2 26 06/16/2014 0918   BUN 12 09/11/2014 1518   BUN 10 06/16/2014 0918   CREATININE 0.79 09/11/2014 1518   CREATININE 0.60 06/16/2014 0918   CREATININE 0.68 01/12/2014 0842      Component Value Date/Time   CALCIUM  8.5* 09/11/2014 1518   CALCIUM 9.3 06/16/2014 0918   ALKPHOS 175* 09/11/2014 1518   ALKPHOS 84 06/16/2014 0918   AST 50* 09/11/2014 1518   AST 25 06/16/2014 0918   ALT 35 09/11/2014 1518   ALT 17 06/16/2014 0918   BILITOT 0.4 09/11/2014 1518   BILITOT 1.0 06/16/2014 0918       STUDIES: US  Abdomen Limited Ruq  09/12/2014   CLINICAL DATA:  Abnormal liver function test.  EXAM: US ABDOMEN LIMITED - RIGHT UPPER QUADRANT  COMPARISON:  None.  FINDINGS: Gallbladder:  No gallstones or wall thickening visualized. No sonographic Murphy sign noted.  Common bile duct:  Diameter: 5.4 mm  Liver:  A large 6.5 x 5.6 x 6.7 cm solid hyperechoic mass containing calcifications noted in the right hepatic lobe. This is concerning for primary or metastatic malignancy. A small 1.3 x 1.0 x 1.3 cm solid lesion is in the left hepatic lobe. This could also represent a primary or metastatic lesion . Further evaluation of the liver with gadolinium-enhanced MRI suggested.  IMPRESSION: 1. Large 6.5 x 5.6 x 6.7 cm solid hyperechoic mass containing calcifications noted in the right hepatic lobe.  2. 1.3 x 1.0 x 1.3 cm hypoechoic solid lesion in the left hepatic lobe .  These lesions could represent primary malignancy or metastatic disease. Gadolinium-enhanced MRI of the liver suggested for further evaluation.   Electronically Signed   By: Marcello Moores  Register   On: 09/12/2014 11:11    ASSESSMENT: 64 y.o. [place], [place] woman status post right lumpectomy and sentinel lymph node sampling 03/06/2014 for a pT1c pN0, stage IA invasive ductal carcinoma, grade 3, estrogen receptor positive, progesterone receptor negative, with HER-2 equivocal by immunohistochemistry but not amplified by FISH  (1) Oncotype DX score of 53 predicts a 10 year risk of recurrence outside the breast greater than 30% if the patient's only systemic treatment is tamoxifen for 5 years. It also predicts significant benefit from chemotherapy  (2) the patient  received 2 cycles of cyclophosphamide and docetaxel completed 05/08/2014;  (3) the patient had residual measurable disease in the right axilla noted at the start of chemotherapy, with biopsy 05/31/2014 showing this to be residual invasive ductal carcinoma, with an identical prognostic profile to the January biopsy  (4) PET scan 06/12/2014 showed, in addition to residual disease in the right breast and right axilla, a 2.4 cm right liver lesion which was biopsied 07/05/2014 (results pending)  (5) UN[facility]seq requested from liver biopsy samples, results pending  (6) letrozole started 06/28/2014, palbociclib to be started later this month  PLAN: I received a phone call from surgical service that ultrasound shows progressive disease Some questions were raised about controlling disease by doing emboli sation  I do not believe any local therapy would be useful.  Pathology from the right mastectomy is pending.  Possibility of sending tumor markers as well as evaluation for HER-2/neu on no new specimen can be considered. The patient is probably progressing on  Ibraance /letrozole Patient had multiple oncology clinic evaluation at [facility] as well as [facility] Dr.  Ma Hillock , primary oncologists is on medication at present time. I would proceed with getting guidance study for further evaluation and getting all information from [facility].  I believe that anti-hormonal therapy may not work at present time considering liver metastases which is progressing and patient may need chemotherapy Question about adding anti-HER-2 therapy depends on HER-2/neu receptor on the new mastectomy specimen Other options would include aggressive therapy with Adriamycin-containing regimen or Abraxane or   eribulin  I had prolonged discussion with patient and family Total duration of visit was 45 minutes.  50% or more time was spent in counseling patient and family regarding prognosis and options of treatment and available  resources

## 2014-09-25 ENCOUNTER — Ambulatory Visit (INDEPENDENT_AMBULATORY_CARE_PROVIDER_SITE_OTHER): Payer: BLUE CROSS/BLUE SHIELD | Admitting: General Surgery

## 2014-09-25 ENCOUNTER — Encounter: Payer: Self-pay | Admitting: General Surgery

## 2014-09-25 ENCOUNTER — Other Ambulatory Visit: Payer: Self-pay | Admitting: *Deleted

## 2014-09-25 VITALS — BP 122/76 | HR 74 | Resp 12 | Ht 61.0 in | Wt 170.0 lb

## 2014-09-25 DIAGNOSIS — C50911 Malignant neoplasm of unspecified site of right female breast: Secondary | ICD-10-CM

## 2014-09-25 DIAGNOSIS — C799 Secondary malignant neoplasm of unspecified site: Secondary | ICD-10-CM

## 2014-09-25 LAB — ANAEROBIC AND AEROBIC CULTURE

## 2014-09-25 NOTE — Patient Instructions (Signed)
Patient to return in one week. 

## 2014-09-25 NOTE — Progress Notes (Signed)
Patient ID: Jenna Pitts, female   DOB: 1951-01-14, 64 y.o.   MRN: [id number]  Chief Complaint  Patient presents with  . Routine Post Op    mastectomy    HPI Jenna Pitts is a 64 y.o. female here today for her post op right mastectomy done 09/15/14. Patient states she is doing well. Further fevers. She is tolerating her Augmentin therapy well. One day left of therapy. General sense of well being is good. Shoulder range of motion is improving.  HPI  Past Medical History  Diagnosis Date  . Blood transfusion without reported diagnosis Newborn  . GERD (gastroesophageal reflux disease)   . Staph infection 1986    AFTER C/S 39 YEARS AGO  . Headache     used to have migraines but no longer  . Breast cancer     metastasized to liver  . Anemia     blood transfusion 40 years ago    Past Surgical History  Procedure Laterality Date  . Breast lumpectomy with axillary lymph node biopsy    . Portacath placement  04/13/14  . Cesarean section  08/06/75, 12/11/78     X2  . Breast surgery Right 03/06/14    partial mastectomy  . Mastectomy modified radical Right 09/15/2014    Procedure: MASTECTOMY MODIFIED RADICAL;  Surgeon: Robert Bellow, MD;  Location: [facility] ORS;  Service: General;  Laterality: Right;    Family History  Problem Relation Age of Onset  . Cancer Mother     COLON  . Cancer Maternal Grandfather     COLON  . Melanoma Sister     Social History History  Substance Use Topics  . Smoking status: Never Smoker   . Smokeless tobacco: Never Used  . Alcohol Use: No    Allergies  Allergen Reactions  . Aspirin     RINGING EAR.  . Band-Aid Plus Antibiotic [Bacitracin-Polymyxin B] Rash    WHEN APPLIED TO CHEST AREA  . Pseudoephedrine Palpitations    Current Outpatient Prescriptions  Medication Sig Dispense Refill  . amoxicillin-clavulanate (AUGMENTIN) 875-125 MG per tablet   0  . Bioflavonoid Products (ESTER-C) TABS Take by mouth.    .  Calcium-Magnesium-Zinc (CALCIUM-MAGNESUIUM-ZINC PO) Take by mouth.    Marland Kitchen HYDROcodone-acetaminophen (NORCO) 5-325 MG per tablet Take 1-2 tablets by mouth every 4 (four) hours as needed for moderate pain. 30 tablet 0  . letrozole (FEMARA) 2.5 MG tablet Take 1 tablet (2.5 mg total) by mouth daily. 30 tablet 11  . Multiple Vitamin (MULTIVITAMIN) capsule Take 1 capsule by mouth daily.    . promethazine (PHENERGAN) 25 MG tablet Take 25 mg by mouth every 4 (four) hours as needed.   2   No current facility-administered medications for this visit.    Review of Systems Review of Systems  Constitutional: Negative.   Respiratory: Negative.   Cardiovascular: Negative.     Blood pressure 122/76, pulse 74, resp. rate 12, height 5' 1"  (1.549 m), weight 170 lb (77.111 kg).  Physical Exam Physical Exam  Constitutional: She is oriented to person, place, and time. She appears well-developed and well-nourished.  Eyes: Conjunctivae are normal. No scleral icterus.  Neck: Neck supple.  Cardiovascular: Normal rate, regular rhythm and normal heart sounds.   Pulmonary/Chest: Effort normal and breath sounds normal.    Right mastectomy is healing well.   Lymphadenopathy:    She has no cervical adenopathy.  Neurological: She is alert and oriented to person, place, and time.  Skin: Skin is warm and dry.    Data Reviewed Medical oncology notes.  HER-2/neu testing on one of the metastatic foci in the lymph node shows this to be 3+ positive.  Drainage volumes are improved but are still over 30 mL per day. Light of her recent infection premature drain removal was not a wise choice.  Assessment    Improvement status post treatment of MSSA soft tissue infection.    Plan    The patient will complete her anti-biotic therapy. She is scheduled for follow-up later this week with medical oncology. Tumor board recommendation from 09/25/2014 will be to add Herceptin and Perjeta her chemotherapy regimen. Patient  will report if she notices any change or recurrent redness in the breast. Follow up otherwise will be in one week.      PCP:  Janeece Agee, Forest Gleason 09/26/2014, 6:31 AM

## 2014-09-26 ENCOUNTER — Encounter: Payer: Self-pay | Admitting: General Surgery

## 2014-09-27 ENCOUNTER — Other Ambulatory Visit: Payer: Self-pay | Admitting: *Deleted

## 2014-09-27 ENCOUNTER — Ambulatory Visit: Payer: BLUE CROSS/BLUE SHIELD | Admitting: General Surgery

## 2014-09-27 DIAGNOSIS — C787 Secondary malignant neoplasm of liver and intrahepatic bile duct: Secondary | ICD-10-CM

## 2014-09-27 DIAGNOSIS — C50911 Malignant neoplasm of unspecified site of right female breast: Secondary | ICD-10-CM

## 2014-09-29 ENCOUNTER — Encounter
Admission: RE | Admit: 2014-09-29 | Discharge: 2014-09-29 | Disposition: A | Discharge status: Home / Self Care | Payer: BLUE CROSS/BLUE SHIELD | Source: Ambulatory Visit | Attending: Internal Medicine | Admitting: Internal Medicine

## 2014-09-29 ENCOUNTER — Ambulatory Visit
Admission: RE | Admit: 2014-09-29 | Discharge: 2014-09-29 | Disposition: A | Discharge status: Home / Self Care | Payer: BLUE CROSS/BLUE SHIELD | Source: Ambulatory Visit | Attending: Internal Medicine | Admitting: Internal Medicine

## 2014-09-29 ENCOUNTER — Inpatient Hospital Stay: Payer: BLUE CROSS/BLUE SHIELD | Attending: Internal Medicine | Admitting: Internal Medicine

## 2014-09-29 VITALS — BP 127/82 | HR 121 | Temp 99.2°F | Resp 18 | Ht 61.0 in | Wt 169.3 lb

## 2014-09-29 DIAGNOSIS — C801 Malignant (primary) neoplasm, unspecified: Secondary | ICD-10-CM | POA: Diagnosis present

## 2014-09-29 DIAGNOSIS — C50911 Malignant neoplasm of unspecified site of right female breast: Secondary | ICD-10-CM

## 2014-09-29 DIAGNOSIS — Z9221 Personal history of antineoplastic chemotherapy: Secondary | ICD-10-CM | POA: Insufficient documentation

## 2014-09-29 DIAGNOSIS — C7951 Secondary malignant neoplasm of bone: Secondary | ICD-10-CM | POA: Insufficient documentation

## 2014-09-29 DIAGNOSIS — C787 Secondary malignant neoplasm of liver and intrahepatic bile duct: Secondary | ICD-10-CM | POA: Insufficient documentation

## 2014-09-29 DIAGNOSIS — C773 Secondary and unspecified malignant neoplasm of axilla and upper limb lymph nodes: Secondary | ICD-10-CM | POA: Diagnosis not present

## 2014-09-29 DIAGNOSIS — Z79899 Other long term (current) drug therapy: Secondary | ICD-10-CM | POA: Insufficient documentation

## 2014-09-29 LAB — GLUCOSE, CAPILLARY: Glucose-Capillary: 81 mg/dL (ref 65–99)

## 2014-09-29 MED ORDER — FLUDEOXYGLUCOSE F - 18 (FDG) INJECTION
13.0000 | Freq: Once | INTRAVENOUS | Status: AC | PRN
  Administered 2014-09-29: 12.04 via INTRAVENOUS

## 2014-09-29 MED ORDER — TECHNETIUM TC 99M-LABELED RED BLOOD CELLS IV KIT
22.0150 | PACK | Freq: Once | INTRAVENOUS | Status: AC | PRN
  Administered 2014-09-29: 22.015 via INTRAVENOUS

## 2014-09-30 NOTE — Progress Notes (Signed)
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  (Initially diagnosed with pT1c pN0 (sn) cM0 stage I grade 3 invasive ductal carcinoma of the right breast upper outer quadrant 10 o'clock position s/p biopsy 02/23/14 and then underwent lumpectomy and SLN at [facility]. ER is positive 85%, PR is negative 0%, HER2/neu 2+ and IHC (equivocal) but FISH is negative, HER2/CEP17 ratio 1.2. Oncotype DX test on 03/17/14 reported very high score of 53, 10-year risk of distant recurrence is greater than 30%. Patient recommended adjuvant chemotherapy by Dr. Alinda Money at [facility], started Taxotere/Cytoxan on 04/17/14. Developed progressive right axillary subcutaneous nodule despite 2 cycles chemotherapy, underwent ultrasound guided biopsy at [facility] on 05/31/14 which reported 2.8 x 1.0 cm mass c/w Infiltrating Ductal Carcinoma, Grade 3, ER +60%, PR negative, HER-2/neu Equivocal and FISH pending. PET scan at [facility] on 06/12/14 - IMPRESSION: Right breast malignancy, extending to the skin measuring 2.6 x 1.7 cm. Malignant adenopathy in the right axilla 9 mm node. Metastatic lesion in the right hepatic lobe 2.4 x 2.2 cm with intense FDG avidity. No suspicious metabolically active osseus lesions are identified.  Liver bx at [facility] positive for metastatic disease.   HISTORY OF PRESENT ILLNESS:  patient returns for continued oncology follow-up. She recently developed progressive subcutaneous nodule in the right lateral breast/anterior axilla and was seen at [facility] and underwent biopsy on April 6 which is reporting invasive ductal carcinoma.  PET scan reports another small right axillary lymph node along with intensely FDG avid right lower lobe hepatic lesion consistent with metastatic disease. Patient got radiation, and was placed on letrozole + ibrance but right breast/axillary mass continues to increase in size and is now constantly draining. Patient has decided to pursue palliative mastectomy which is scheduled in a few days. She is taking norco for pain control. Denies any abdominal pain. Appetite is good. No fevers or chills. No new cough, chest pain or hemoptysis.   REVIEW OF SYSTEMS:   ROS As in HPI above. In addition, no fever, chills or sweats. No new headaches or focal weakness.  No new sore throat, cough, shortness of breath, sputum, hemoptysis or chest pain. No dizziness or palpitation. No constipation, diarrhea, dysuria or hematuria. No new skin rash or bleeding symptoms. No new paresthesias in extremities. PS ECOG 1.  PAST MEDICAL HISTORY: Reviewed. Past Medical History  Diagnosis Date  . Blood transfusion without reported diagnosis [place]  . GERD (gastroesophageal reflux disease)   . Staph infection 1986    AFTER C/S 39 YEARS AGO  . Headache     used to have migraines but no longer  . Breast cancer     metastasized to liver  . Anemia     blood transfusion 40 years ago    PAST SURGICAL HISTORY: Reviewed. Past Surgical History  Procedure Laterality Date  . Breast lumpectomy with axillary lymph node biopsy    . Portacath placement  04/13/14  . Cesarean section  08/06/75, 12/11/78     X2  . Breast surgery Right 03/06/14    partial mastectomy  . Mastectomy modified radical Right 09/15/2014  Procedure: MASTECTOMY MODIFIED RADICAL;  Surgeon: Robert Bellow, MD;  Location: [facility] ORS;  Service: General;  Laterality: Right;    FAMILY HISTORY: Reviewed. Family History  Problem Relation Age of Onset  . Cancer Mother     COLON  . Cancer Maternal Grandfather     COLON  . Melanoma Sister      SOCIAL HISTORY: Reviewed. History  Substance Use Topics  . Smoking status: Never Smoker   . Smokeless tobacco: Never Used  . Alcohol Use: No    Allergies  Allergen Reactions  . Aspirin     RINGING EAR.  . Band-Aid Plus Antibiotic [Bacitracin-Polymyxin B] Rash    WHEN APPLIED TO CHEST AREA  . Pseudoephedrine Palpitations    Current Outpatient Prescriptions  Medication Sig Dispense Refill  . Calcium-Magnesium-Zinc (CALCIUM-MAGNESUIUM-ZINC PO) Take by mouth.    . letrozole (FEMARA) 2.5 MG tablet Take 1 tablet (2.5 mg total) by mouth daily. 30 tablet 11  . Multiple Vitamin (MULTIVITAMIN) capsule Take 1 capsule by mouth daily.    Marland Kitchen amoxicillin-clavulanate (AUGMENTIN) 875-125 MG per tablet   0  . Bioflavonoid Products (ESTER-C) TABS Take by mouth.    Marland Kitchen HYDROcodone-acetaminophen (NORCO) 5-325 MG per tablet Take 1-2 tablets by mouth every 4 (four) hours as needed for moderate pain. 30 tablet 0  . promethazine (PHENERGAN) 25 MG tablet Take 25 mg by mouth every 4 (four) hours as needed.   2   No current facility-administered medications for this visit.    PHYSICAL EXAM: Filed Vitals:   09/11/14 1538  BP: 119/75  Pulse: 91  Temp: 98.1 F (36.7 C)  Resp: 18     Body mass index is 28.41 kg/(m^2).    ECOG FS:1 - Symptomatic but completely ambulatory  GENERAL: Patient is alert and oriented and in no acute distress. There is no icterus. HEENT: EOMs intact. Oral exam negative for thrush or lesions. No cervical lymphadenopathy. CVS: S1S2, regular LUNGS: Bilaterally clear to auscultation, no rhonchi. ABDOMEN: Soft, nontender. Liver just palpable.  NEURO: grossly nonfocal, cranial nerves are intact.   EXTREMITIES: No pedal edema. Right axillary/lateral breast subcutaneous mass 4-5 cms size.  LAB RESULTS:    Component Value Date/Time   NA 136 09/11/2014 1518   NA 138 06/16/2014 0918   K 4.0 09/11/2014 1518   K 3.8 06/16/2014 0918   CL 102 09/11/2014 1518   CL 104 06/16/2014  0918   CO2 28 09/11/2014 1518   CO2 26 06/16/2014 0918   GLUCOSE 111* 09/11/2014 1518   GLUCOSE 120* 06/16/2014 0918   BUN 12 09/11/2014 1518   BUN 10 06/16/2014 0918   CREATININE 0.79 09/11/2014 1518   CREATININE 0.60 06/16/2014 0918   CREATININE 0.68 01/12/2014 0842   CALCIUM 8.5* 09/11/2014 1518   CALCIUM 9.3 06/16/2014 0918   PROT 7.3 09/11/2014 1518   PROT 7.4 06/16/2014 0918   ALBUMIN 3.6 09/11/2014 1518   ALBUMIN 4.0 06/16/2014 0918   AST 50* 09/11/2014 1518   AST 25 06/16/2014 0918   ALT 35 09/11/2014 1518   ALT 17 06/16/2014 0918   ALKPHOS 175* 09/11/2014 1518   ALKPHOS 84 06/16/2014 0918   BILITOT 0.4 09/11/2014 1518   BILITOT 1.0 06/16/2014 0918   GFRNONAA >60 09/11/2014 1518   GFRNONAA >60 06/16/2014 0918   GFRAA >60 09/11/2014 1518   GFRAA >60 06/16/2014 0918    Lab Results  Component Value Date   WBC 2.2* 09/11/2014   NEUTROABS 1.5 09/11/2014   HGB 10.7* 09/11/2014  HCT 33.0* 09/11/2014   MCV 96.4 09/11/2014   PLT 310 09/11/2014    No results found for: IRON    STUDIES:   STAGING: Breast cancer, right breast   Staging form: Breast, AJCC 7th Edition     Clinical: Stage IV (TX, NX, M1) - Signed by Chauncey Cruel, MD on 07/07/2014.    ASSESSMENT / PLAN:   1. 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 Oncotype DX test on 03/17/14 reported very high score of 53, 10-year risk of distant recurrence is greater than 30%). Started adjuvant chemotherapy with Taxotere/Cytoxan on 04/17/14. Has stage IV disease given that she has liver metastasis, and right axillary node along with right lateral subcutaneous breast mass being positive for malignancy on biopsy done 05/31/14. Liver bx at [facility] positive  for metastatic disease.   HISTORY OF PRESENT ILLNESS:  patient returns for continued oncology follow-up. She recently developed progressive subcutaneous nodule in the right lateral breast/anterior axilla and was seen at [facility] and underwent biopsy on April 6 which is reporting invasive ductal carcinoma. PET scan reports another small right axillary lymph node along with intensely FDG avid right lower lobe hepatic lesion consistent with metastatic disease. Patient got radiation, and was placed on letrozole + ibrance but right breast/axillary mass continues to increase in size and is now constantly draining. Patient has decided to pursue palliative mastectomy which is scheduled in a few days - given abnormal LFTs will get ultrasound liver to make sure liver mets are not rapidly progressing. She still wants mastectomy. Leslee Home has been placed on hold given planned surgery. Will see her back after surgery and plan continued systemic therapy. Patient and family are aware that overall prognosis is poor especially if disease does not respond to treatment.   2. Nausea - controlled well with Phenergan as needed. 3. Pain - no active issues at this time  4. Leucopenia - from Ibrance effect, currently on hold. 5. In between visits, the patient has been advised to call or come to the ER in case of fevers, bleeding, acute sickness or new symptoms. She is agreeable to this plan.     Leia Alf, MD   09/30/2014 10:18 AM

## 2014-10-02 ENCOUNTER — Telehealth: Payer: Self-pay | Admitting: *Deleted

## 2014-10-02 ENCOUNTER — Inpatient Hospital Stay: Payer: BLUE CROSS/BLUE SHIELD

## 2014-10-02 NOTE — Telephone Encounter (Signed)
Called pt with plan after pandit and choksi spoke.  Chemotherapy Navelbine, herceptin, perjeta. Regimen. navelbine will be once a week for 3 weeks and 1 week off.  The herceptin and perjeta will be once every 3 weeks.  I have contacted Juliann Pulse to get authorization for treatment and contact dr Bary Castilla office and he is off today and will be back tom. To check from surgery standpoint can we start treatment yet or more healing needs to be done.  I have sent electronic message to Dr.  Bary Castilla and will await his response.  Patient has been given appt for 9 am on Thursday and it is subject to all above and I told her that I will call her back when I have all things taken care of and she is agreeable to plan

## 2014-10-03 ENCOUNTER — Encounter: Payer: Self-pay | Admitting: Family Medicine

## 2014-10-03 ENCOUNTER — Encounter: Payer: Self-pay | Admitting: General Surgery

## 2014-10-03 ENCOUNTER — Ambulatory Visit (INDEPENDENT_AMBULATORY_CARE_PROVIDER_SITE_OTHER): Payer: BLUE CROSS/BLUE SHIELD | Admitting: General Surgery

## 2014-10-03 VITALS — BP 132/74 | HR 78 | Resp 14 | Ht 66.0 in | Wt 170.0 lb

## 2014-10-03 DIAGNOSIS — C50911 Malignant neoplasm of unspecified site of right female breast: Secondary | ICD-10-CM

## 2014-10-03 DIAGNOSIS — C799 Secondary malignant neoplasm of unspecified site: Secondary | ICD-10-CM

## 2014-10-03 MED ORDER — HYDROCODONE-ACETAMINOPHEN 5-325 MG PO TABS: 1.0000 | ORAL_TABLET | ORAL | Status: DC | PRN

## 2014-10-03 NOTE — Progress Notes (Signed)
Patient ID: Jenna Pitts, female   DOB: [birth year], 64 y.o.   MRN: [id number]  Chief Complaint  Patient presents with  . Routine Post Op    right mastectomy    HPI Jenna Pitts is a 64 y.o. female here today for her post op right mastectomy done on 09/15/14. Patient states she is doing well at this time. Drainage volumes have continually decreased. Shoulder range of motion is improving. She not sleeping well at night.  HPI  Past Medical History  Diagnosis Date  . Blood transfusion without reported diagnosis [place]  . GERD (gastroesophageal reflux disease)   . Staph infection 1986    AFTER C/S 39 YEARS AGO  . Headache     used to have migraines but no longer  . Breast cancer     metastasized to liver  . Anemia     blood transfusion 40 years ago    Past Surgical History  Procedure Laterality Date  . Breast lumpectomy with axillary lymph node biopsy    . Portacath placement  04/13/14  . Cesarean section  08/06/75, 12/11/78     X2  . Breast surgery Right 03/06/14    partial mastectomy  . Mastectomy modified radical Right 09/15/2014    Procedure: MASTECTOMY MODIFIED RADICAL;  Surgeon: Robert Bellow, MD;  Location: [facility] ORS;  Service: General;  Laterality: Right;    Family History  Problem Relation Age of Onset  . Cancer Mother     COLON  . Cancer Maternal Grandfather     COLON  . Melanoma Sister     Social History Social History  Substance Use Topics  . Smoking status: Never Smoker   . Smokeless tobacco: Never Used  . Alcohol Use: No    Allergies  Allergen Reactions  . Aspirin     RINGING EAR.  . Band-Aid Plus Antibiotic [Bacitracin-Polymyxin B] Rash    WHEN APPLIED TO CHEST AREA  . Pseudoephedrine Palpitations    Current Outpatient Prescriptions  Medication Sig Dispense Refill  . amoxicillin-clavulanate (AUGMENTIN) 875-125 MG per tablet   0  . Bioflavonoid Products (ESTER-C) TABS Take by mouth.    . Calcium-Magnesium-Zinc  (CALCIUM-MAGNESUIUM-ZINC PO) Take by mouth.    Marland Kitchen HYDROcodone-acetaminophen (NORCO) 5-325 MG per tablet Take 1-2 tablets by mouth every 4 (four) hours as needed for moderate pain. 90 tablet 0  . letrozole (FEMARA) 2.5 MG tablet Take 1 tablet (2.5 mg total) by mouth daily. 30 tablet 11  . Multiple Vitamin (MULTIVITAMIN) capsule Take 1 capsule by mouth daily.    . promethazine (PHENERGAN) 25 MG tablet Take 25 mg by mouth every 4 (four) hours as needed.   2   No current facility-administered medications for this visit.    Review of Systems Review of Systems  Constitutional: Negative.   Respiratory: Negative.   Cardiovascular: Negative.   Gastrointestinal:       Abdominal discomfort in the right upper quadrant    Blood pressure 132/74, pulse 78, resp. rate 14, height 5\' 6"  (1.676 m), weight 170 lb (77.111 kg).  Physical Exam Physical Exam  Constitutional: She is oriented to person, place, and time. She appears well-developed and well-nourished.  Pulmonary/Chest:    Neurological: She is alert and oriented to person, place, and time.  Skin: Skin is warm and dry.  Drain removed   Data Reviewed Ultrasound had previously documented a significant increase in size of the right lobe lesion from 2 cm to 6 cm. Recently  completed PET/CT shows massive liver involvement with metastatic disease.  Assessment    Doing well status post salvage mastectomy for wound care. Extensive disease progression.    Plan    The patient will resume chemotherapy on 10/05/2014. This may have some impact on wound healing, but considering the rapid progression of hepatic disease this is mandatory.  The patient has been advised that she may develop a small fluid collection post drain removal and this should not be alarming unless it is uncomfortable. She'll call if she is symptomatic otherwise will plan for follow-up exam in one week.     PCP:  Janeece Agee, [place] Gleason 10/05/2014, 10:16 AM

## 2014-10-04 NOTE — Progress Notes (Signed)
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  (Initially diagnosed with pT1c pN0 (sn) cM0 stage I grade 3 invasive ductal carcinoma of the right breast upper outer quadrant 10 o'clock position s/p biopsy 02/23/14 and then underwent lumpectomy and SLN at [facility]. ER is positive 85%, PR is negative 0%, HER2/neu 2+ and IHC (equivocal) but FISH is negative, HER2/CEP17 ratio 1.2. Oncotype DX test on 03/17/14 reported very high score of 53, 10-year risk of distant recurrence is greater than 30%. Patient recommended adjuvant chemotherapy by Dr. Alinda Money at [facility], started Taxotere/Cytoxan on 04/17/14. Developed progressive right axillary subcutaneous nodule despite 2 cycles chemotherapy, underwent ultrasound guided biopsy at [facility] on 05/31/14 which reported 2.8 x 1.0 cm mass c/w Infiltrating Ductal Carcinoma, Grade 3, ER +60%, PR negative, HER-2/neu Equivocal and FISH pending. PET scan at [facility] on 06/12/14 - IMPRESSION: Right breast malignancy, extending to the skin measuring 2.6 x 1.7 cm. Malignant adenopathy in the right axilla 9 mm node. Metastatic lesion in the right hepatic lobe 2.4 x 2.2 cm with intense FDG avidity. No suspicious metabolically active osseus lesions are identified.  Liver bx at [facility] positive for metastatic disease.   HISTORY OF PRESENT ILLNESS:  patient returns for continued oncology follow-up. She had PET scan today. Clinically states she has mild pain in upper abdomen, not needing to take pain medication. Appetite is fair. She had mastectomy for palliation.  No new cough, chest pain or  hemoptysis.   REVIEW OF SYSTEMS:   ROS As in HPI above. In addition, no fever, chills. No new headaches or focal weakness.  No new sore throat, cough, shortness of breath, sputum, hemoptysis or chest pain. No constipation, diarrhea, dysuria or hematuria. PS ECOG 1.  PAST MEDICAL HISTORY: Reviewed. Past Medical History  Diagnosis Date  . Blood transfusion without reported diagnosis [place]  . GERD (gastroesophageal reflux disease)   . Staph infection 1986    AFTER C/S 39 YEARS AGO  . Headache     used to have migraines but no longer  . Breast cancer     metastasized to liver  . Anemia     blood transfusion 40 years ago    PAST SURGICAL HISTORY: Reviewed. Past Surgical History  Procedure Laterality Date  . Breast lumpectomy with axillary lymph node biopsy    . Portacath placement  04/13/14  . Cesarean section  08/06/75, 12/11/78     X2  . Breast surgery Right 03/06/14    partial mastectomy  . Mastectomy modified radical Right 09/15/2014    Procedure: MASTECTOMY MODIFIED RADICAL;  Surgeon: Robert Bellow, MD;  Location: [facility] ORS;  Service: General;  Laterality: Right;    FAMILY HISTORY: Reviewed. Family History  Problem Relation Age of Onset  . Cancer Mother     COLON  . Cancer Maternal Grandfather     COLON  . Melanoma Sister     SOCIAL HISTORY: Reviewed. Social History  Substance Use Topics  . Smoking status: Never Smoker   . Smokeless tobacco: Never Used  . Alcohol Use: No    Allergies  Allergen Reactions  .  Aspirin     RINGING EAR.  . Band-Aid Plus Antibiotic [Bacitracin-Polymyxin B] Rash    WHEN APPLIED TO CHEST AREA  . Pseudoephedrine Palpitations    Current Outpatient Prescriptions  Medication Sig Dispense Refill  . amoxicillin-clavulanate (AUGMENTIN) 875-125 MG per tablet   0  . Bioflavonoid Products (ESTER-C) TABS Take by mouth.    . Calcium-Magnesium-Zinc (CALCIUM-MAGNESUIUM-ZINC PO) Take by mouth.    Marland Kitchen HYDROcodone-acetaminophen  (NORCO) 5-325 MG per tablet Take 1-2 tablets by mouth every 4 (four) hours as needed for moderate pain. 90 tablet 0  . letrozole (FEMARA) 2.5 MG tablet Take 1 tablet (2.5 mg total) by mouth daily. 30 tablet 11  . Multiple Vitamin (MULTIVITAMIN) capsule Take 1 capsule by mouth daily.    . promethazine (PHENERGAN) 25 MG tablet Take 25 mg by mouth every 4 (four) hours as needed.   2   No current facility-administered medications for this visit.    PHYSICAL EXAM: Filed Vitals:   09/29/14 1332  BP: 127/82  Pulse: 121  Temp: 99.2 F (37.3 C)  Resp: 18     Body mass index is 32.01 kg/(m^2).    ECOG FS:1 - Symptomatic but completely ambulatory  GENERAL: Patient is alert and oriented and in no acute distress. There is no icterus. LUNGS: Bilaterally clear to auscultation, no rhonchi. ABDOMEN: Soft, nontender. Liver just palpable.  NEURO: grossly nonfocal, cranial nerves are intact.    LAB RESULTS:    Component Value Date/Time   NA 136 09/11/2014 1518   NA 138 06/16/2014 0918   K 4.0 09/11/2014 1518   K 3.8 06/16/2014 0918   CL 102 09/11/2014 1518   CL 104 06/16/2014 0918   CO2 28 09/11/2014 1518   CO2 26 06/16/2014 0918   GLUCOSE 111* 09/11/2014 1518   GLUCOSE 120* 06/16/2014 0918   BUN 12 09/11/2014 1518   BUN 10 06/16/2014 0918   CREATININE 0.79 09/11/2014 1518   CREATININE 0.60 06/16/2014 0918   CREATININE 0.68 01/12/2014 0842   CALCIUM 8.5* 09/11/2014 1518   CALCIUM 9.3 06/16/2014 0918   PROT 7.3 09/11/2014 1518   PROT 7.4 06/16/2014 0918   ALBUMIN 3.6 09/11/2014 1518   ALBUMIN 4.0 06/16/2014 0918   AST 50* 09/11/2014 1518   AST 25 06/16/2014 0918   ALT 35 09/11/2014 1518   ALT 17 06/16/2014 0918   ALKPHOS 175* 09/11/2014 1518   ALKPHOS 84 06/16/2014 0918   BILITOT 0.4 09/11/2014 1518   BILITOT 1.0 06/16/2014 0918   GFRNONAA >60 09/11/2014 1518   GFRNONAA >60 06/16/2014 0918   GFRAA >60 09/11/2014 1518   GFRAA >60 06/16/2014 0918    Lab Results  Component Value  Date   WBC 2.2* 09/11/2014   NEUTROABS 1.5 09/11/2014   HGB 10.7* 09/11/2014   HCT 33.0* 09/11/2014   MCV 96.4 09/11/2014   PLT 310 09/11/2014    STUDIES: 09/29/14 - PET Scan. IMPRESSION: Hypermetabolic mediastinal and hilar lymphadenopathy. Tiny bilateral pulmonary metastases. Some of the larger metastases demonstrate hypermetabolic FDG accumulation on PET imaging. Bulky hypermetabolic liver metastases. Hypermetabolic bone metastases within the axial skeleton.  STAGING: Breast cancer, right breast   Staging form: Breast, AJCC 7th Edition     Clinical: Stage IV (TX, NX, M1) - Signed by Chauncey Cruel, MD on 07/07/2014.    ASSESSMENT / PLAN:   1. 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 Oncotype DX test on 03/17/14 reported very high score of 53, 10-year risk of distant recurrence is greater than 30%). Started adjuvant chemotherapy with Taxotere/Cytoxan on 04/17/14. Has stage IV disease given that she has liver metastasis, and right axillary node along with right lateral subcutaneous breast mass being positive for malignancy on biopsy done 05/31/14. Liver bx at [facility] positive for metastatic disease - reviewed PET scan and d/w patient. Explained PET scan shows rapid progresive liver mets alongwith mediastinal adenopathy, bone mets. Have recommended starting on palliative chemo ASAP given aggressive disease. Given repeat Her2neu testing is 3+, will pursue combination of Herceptin + Perjeta + Navelbine (has recenty received taxotere based chemo). Have explained overall palliative intent of this chemo regimen, possible response rates and possible side effects, she is agreeable. Will d/w surgeon next week and start chemo as soon as cleared by  him.  2. Pain - no active issues at this time  3. In between visits, the patient has been advised to call or come to the ER in case of fevers, bleeding, acute sickness or new symptoms. She is agreeable to this plan.     Leia Alf, MD   10/04/2014 11:00 PM

## 2014-10-05 ENCOUNTER — Inpatient Hospital Stay: Payer: BLUE CROSS/BLUE SHIELD

## 2014-10-05 VITALS — BP 121/79 | HR 144 | Resp 20

## 2014-10-05 DIAGNOSIS — C50911 Malignant neoplasm of unspecified site of right female breast: Secondary | ICD-10-CM | POA: Diagnosis not present

## 2014-10-05 LAB — CBC WITH DIFFERENTIAL/PLATELET
Basophils Absolute: 0 10*3/uL (ref 0–0.1)
Basophils Relative: 1 %
Eosinophils Absolute: 0 10*3/uL (ref 0–0.7)
Eosinophils Relative: 0 %
HCT: 33.4 % — ABNORMAL LOW (ref 35.0–47.0)
HEMOGLOBIN: 11.2 g/dL — AB (ref 12.0–16.0)
LYMPHS ABS: 0.3 10*3/uL — AB (ref 1.0–3.6)
Lymphocytes Relative: 3 %
MCH: 31.4 pg (ref 26.0–34.0)
MCHC: 33.4 g/dL (ref 32.0–36.0)
MCV: 93.9 fL (ref 80.0–100.0)
MONOS PCT: 12 %
Monocytes Absolute: 1 10*3/uL — ABNORMAL HIGH (ref 0.2–0.9)
NEUTROS PCT: 84 %
Neutro Abs: 7.7 10*3/uL — ABNORMAL HIGH (ref 1.4–6.5)
Platelets: 244 10*3/uL (ref 150–440)
RBC: 3.56 MIL/uL — AB (ref 3.80–5.20)
RDW: 19.4 % — ABNORMAL HIGH (ref 11.5–14.5)
WBC: 9.1 10*3/uL (ref 3.6–11.0)

## 2014-10-05 LAB — CALCIUM: Calcium: 8.7 mg/dL — ABNORMAL LOW (ref 8.9–10.3)

## 2014-10-05 LAB — CREATININE, SERUM
CREATININE: 0.61 mg/dL (ref 0.44–1.00)
GFR calc Af Amer: >60 mL/min (ref >60)
GFR calc non Af Amer: >60 mL/min (ref >60)

## 2014-10-05 MED ORDER — SODIUM CHLORIDE 0.9 % IV SOLN
Freq: Once | INTRAVENOUS | Status: AC
  Administered 2014-10-05: 11:00:00 via INTRAVENOUS
  Filled 2014-10-05: qty 1000

## 2014-10-05 MED ORDER — VINORELBINE TARTRATE CHEMO INJECTION 50 MG/5ML
30.0000 mg/m2 | Freq: Once | INTRAVENOUS | Status: AC
  Administered 2014-10-05: 57 mg via INTRAVENOUS
  Filled 2014-10-05: qty 5.7

## 2014-10-05 MED ORDER — DIPHENHYDRAMINE HCL 25 MG PO CAPS
50.0000 mg | ORAL_CAPSULE | Freq: Once | ORAL | Status: AC
  Administered 2014-10-05: 50 mg via ORAL
  Filled 2014-10-05: qty 2

## 2014-10-05 MED ORDER — PROCHLORPERAZINE MALEATE 10 MG PO TABS
10.0000 mg | ORAL_TABLET | Freq: Once | ORAL | Status: AC
  Administered 2014-10-05: 10 mg via ORAL
  Filled 2014-10-05: qty 1

## 2014-10-05 MED ORDER — ACETAMINOPHEN 325 MG PO TABS
650.0000 mg | ORAL_TABLET | Freq: Once | ORAL | Status: AC
  Administered 2014-10-05: 650 mg via ORAL
  Filled 2014-10-05: qty 2

## 2014-10-05 MED ORDER — TRASTUZUMAB CHEMO INJECTION 440 MG
8.0000 mg/kg | Freq: Once | INTRAVENOUS | Status: AC
  Administered 2014-10-05: 609 mg via INTRAVENOUS
  Filled 2014-10-05: qty 29

## 2014-10-05 MED ORDER — SODIUM CHLORIDE 0.9 % IV SOLN
840.0000 mg | Freq: Once | INTRAVENOUS | Status: AC
  Administered 2014-10-05: 840 mg via INTRAVENOUS
  Filled 2014-10-05: qty 28

## 2014-10-05 MED ORDER — DENOSUMAB 120 MG/1.7ML ~~LOC~~ SOLN
120.0000 mg | Freq: Once | SUBCUTANEOUS | Status: AC
  Administered 2014-10-05: 120 mg via SUBCUTANEOUS
  Filled 2014-10-05: qty 1.7

## 2014-10-05 MED ORDER — HEPARIN SOD (PORK) LOCK FLUSH 100 UNIT/ML IV SOLN
500.0000 [IU] | Freq: Once | INTRAVENOUS | Status: AC | PRN
  Administered 2014-10-05: 500 [IU]
  Filled 2014-10-05 (×2): qty 5

## 2014-10-09 ENCOUNTER — Telehealth: Payer: Self-pay | Admitting: *Deleted

## 2014-10-09 ENCOUNTER — Other Ambulatory Visit: Payer: Self-pay | Admitting: Internal Medicine

## 2014-10-09 DIAGNOSIS — C50911 Malignant neoplasm of unspecified site of right female breast: Secondary | ICD-10-CM

## 2014-10-09 MED ORDER — FENTANYL 12 MCG/HR TD PT72: 12.0000 ug | MEDICATED_PATCH | TRANSDERMAL | Status: DC

## 2014-10-09 NOTE — Telephone Encounter (Signed)
Husband called stating pain is bad for pt in right upper part of abdomen as well as back on right side around same level.  She is taking a lot of pain pills and calling in for what next to do. Spoke to pandit and he wanted to start duragesic patch and pt is agreeable and she has hydrocodone still at home.  Told pt and husband that it takes 15-17 hours for patch to start to work and get into her blood stream so for that amount of time she will still need the hydrocodone and after the patch kicks in hopefully she would not need the hydrocodone as often and pain to be more relieved.  Also it can cause more constipation and they are already using stool softners and laxative when needed.  Husband will pick up rx today for new patch. Explained to rotate sites and do not put it over the heart area, arms, belly, back. Both pt and husband had no questions about the above but had not recvd appt sch. Yet.  I have spoke to  [facility] and they will enter the new appts and husband can get it when he comes

## 2014-10-10 ENCOUNTER — Ambulatory Visit (INDEPENDENT_AMBULATORY_CARE_PROVIDER_SITE_OTHER): Payer: BLUE CROSS/BLUE SHIELD | Admitting: General Surgery

## 2014-10-10 ENCOUNTER — Other Ambulatory Visit: Payer: Self-pay

## 2014-10-10 ENCOUNTER — Inpatient Hospital Stay: Payer: BLUE CROSS/BLUE SHIELD

## 2014-10-10 ENCOUNTER — Other Ambulatory Visit: Payer: Self-pay | Admitting: *Deleted

## 2014-10-10 ENCOUNTER — Encounter: Payer: Self-pay | Admitting: General Surgery

## 2014-10-10 ENCOUNTER — Inpatient Hospital Stay (HOSPITAL_BASED_OUTPATIENT_CLINIC_OR_DEPARTMENT_OTHER): Payer: BLUE CROSS/BLUE SHIELD | Admitting: Internal Medicine

## 2014-10-10 ENCOUNTER — Encounter: Payer: Self-pay | Admitting: Internal Medicine

## 2014-10-10 VITALS — BP 134/82 | HR 122 | Temp 97.1°F | Resp 14 | Ht 66.0 in | Wt 171.0 lb

## 2014-10-10 VITALS — BP 109/73 | HR 124 | Temp 98.3°F | Resp 20

## 2014-10-10 DIAGNOSIS — C50911 Malignant neoplasm of unspecified site of right female breast: Secondary | ICD-10-CM

## 2014-10-10 DIAGNOSIS — C773 Secondary and unspecified malignant neoplasm of axilla and upper limb lymph nodes: Secondary | ICD-10-CM

## 2014-10-10 DIAGNOSIS — C799 Secondary malignant neoplasm of unspecified site: Secondary | ICD-10-CM

## 2014-10-10 DIAGNOSIS — C787 Secondary malignant neoplasm of liver and intrahepatic bile duct: Secondary | ICD-10-CM | POA: Diagnosis not present

## 2014-10-10 DIAGNOSIS — C7951 Secondary malignant neoplasm of bone: Secondary | ICD-10-CM | POA: Diagnosis not present

## 2014-10-10 DIAGNOSIS — Z9221 Personal history of antineoplastic chemotherapy: Secondary | ICD-10-CM

## 2014-10-10 DIAGNOSIS — Z170 Estrogen receptor positive status [ER+]: Secondary | ICD-10-CM

## 2014-10-10 DIAGNOSIS — R17 Unspecified jaundice: Secondary | ICD-10-CM | POA: Insufficient documentation

## 2014-10-10 DIAGNOSIS — Z79899 Other long term (current) drug therapy: Secondary | ICD-10-CM

## 2014-10-10 LAB — CBC WITH DIFFERENTIAL/PLATELET
BASOS PCT: 1 %
Basophils Absolute: 0 10*3/uL (ref 0–0.1)
Eosinophils Absolute: 0 10*3/uL (ref 0–0.7)
Eosinophils Relative: 0 %
HEMATOCRIT: 30.4 % — AB (ref 35.0–47.0)
Hemoglobin: 10.1 g/dL — ABNORMAL LOW (ref 12.0–16.0)
Lymphocytes Relative: 6 %
Lymphs Abs: 0.2 10*3/uL — ABNORMAL LOW (ref 1.0–3.6)
MCH: 31 pg (ref 26.0–34.0)
MCHC: 33.3 g/dL (ref 32.0–36.0)
MCV: 93.1 fL (ref 80.0–100.0)
MONO ABS: 0.2 10*3/uL (ref 0.2–0.9)
MONOS PCT: 4 %
NEUTROS ABS: 3.5 10*3/uL (ref 1.4–6.5)
Neutrophils Relative %: 89 %
Platelets: 197 10*3/uL (ref 150–440)
RBC: 3.26 MIL/uL — ABNORMAL LOW (ref 3.80–5.20)
RDW: 18.9 % — AB (ref 11.5–14.5)
WBC: 3.9 10*3/uL (ref 3.6–11.0)

## 2014-10-10 LAB — BASIC METABOLIC PANEL
Anion gap: 16 — ABNORMAL HIGH (ref 5–15)
BUN: 10 mg/dL (ref 6–20)
CALCIUM: 7.6 mg/dL — AB (ref 8.9–10.3)
CO2: 21 mmol/L — AB (ref 22–32)
CREATININE: 0.48 mg/dL (ref 0.44–1.00)
Chloride: 93 mmol/L — ABNORMAL LOW (ref 101–111)
GFR calc non Af Amer: >60 mL/min (ref >60)
GLUCOSE: 110 mg/dL — AB (ref 65–99)
Potassium: 3.6 mmol/L (ref 3.5–5.1)
Sodium: 130 mmol/L — ABNORMAL LOW (ref 135–145)

## 2014-10-10 LAB — HEPATIC FUNCTION PANEL
ALT: 118 U/L — ABNORMAL HIGH (ref 14–54)
AST: 223 U/L — ABNORMAL HIGH (ref 15–41)
Albumin: 2.5 g/dL — ABNORMAL LOW (ref 3.5–5.0)
Alkaline Phosphatase: 932 U/L — ABNORMAL HIGH (ref 38–126)
BILIRUBIN DIRECT: 2.4 mg/dL — AB (ref 0.1–0.5)
BILIRUBIN INDIRECT: 1.7 mg/dL — AB (ref 0.3–0.9)
BILIRUBIN TOTAL: 4.1 mg/dL — AB (ref 0.3–1.2)
Total Protein: 6.4 g/dL — ABNORMAL LOW (ref 6.5–8.1)

## 2014-10-10 MED ORDER — ONDANSETRON 4 MG PO TBDP: 4.0000 mg | ORAL_TABLET | ORAL | Status: AC | PRN

## 2014-10-10 MED ORDER — SODIUM CHLORIDE 0.9 % IV SOLN
Freq: Once | INTRAVENOUS | Status: AC
  Administered 2014-10-10: 13:00:00 via INTRAVENOUS
  Filled 2014-10-10: qty 4

## 2014-10-10 MED ORDER — HEPARIN SOD (PORK) LOCK FLUSH 100 UNIT/ML IV SOLN
INTRAVENOUS | Status: AC
  Filled 2014-10-10: qty 5

## 2014-10-10 MED ORDER — SODIUM CHLORIDE 0.45 % IV SOLN
Freq: Once | INTRAVENOUS | Status: AC
  Administered 2014-10-10: 12:00:00 via INTRAVENOUS
  Filled 2014-10-10: qty 1000

## 2014-10-10 NOTE — Progress Notes (Signed)
Pt here for nausea, indigestion.  Has not drank much today.  Has not felt pain patch to kick in.  She took 2 pain pills last night 11 pm, then 3 am this am and then 7 am. She can lay little bit on right side with pillow behind her is the most comfortable.

## 2014-10-10 NOTE — Progress Notes (Signed)
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  (Initially diagnosed with pT1c pN0 (sn) cM0 stage I grade 3 invasive ductal carcinoma of the right breast upper outer quadrant 10 o'clock position s/p biopsy 02/23/14 and then underwent lumpectomy and SLN at [facility]. ER is positive 85%, PR is negative 0%, HER2/neu 2+ and IHC (equivocal) but FISH is negative, HER2/CEP17 ratio 1.2. Oncotype DX test on 03/17/14 reported very high score of 53, 10-year risk of distant recurrence is greater than 30%. Patient recommended adjuvant chemotherapy by Dr. Alinda Money at [facility], started Taxotere/Cytoxan on 04/17/14. Developed progressive right axillary subcutaneous nodule despite 2 cycles chemotherapy, underwent ultrasound guided biopsy at [facility] on 05/31/14 which reported 2.8 x 1.0 cm mass c/w Infiltrating Ductal Carcinoma, Grade 3, ER +60%, PR negative, HER-2/neu Equivocal and FISH pending. PET scan at [facility] on 06/12/14 - IMPRESSION: Right breast malignancy, extending to the skin measuring 2.6 x 1.7 cm. Malignant adenopathy in the right axilla 9 mm node. Metastatic lesion in the right hepatic lobe 2.4 x 2.2 cm with intense FDG avidity. No suspicious metabolically active osseus lesions are identified.  Liver bx at [facility] positive for metastatic disease.  Started palliative Navelbine/Herceptin/Perjeta on 10/03/14.   HISTORY OF PRESENT ILLNESS:  Patient seen as work in appointment, she is feeling weak and lightheaded along with dry heaves and nausea. Denies any diarrhea. States that she tolerated chemotherapy last Thursday  without side effects but for the last 2 days has been having GI upset with indigestion, belching and about symptoms. States that she is eating fair. Also is jaundiced. No new cough, chest pain or hemoptysis. She has had significant right upper quadrant abdominal pain and fentanyl 12 g per hour patch was added yesterday, currently feels pain is coming under better control.   REVIEW OF SYSTEMS:   ROS As in HPI above. In addition, no fever, chills. No new headaches or focal weakness.  No new sore throat or dysphagia. No constipation, diarrhea, dysuria or hematuria. Denies bleeding symptoms. PS ECOG 2.  PAST MEDICAL HISTORY: Reviewed. Past Medical History  Diagnosis Date  . Blood transfusion without reported diagnosis [place]  . GERD (gastroesophageal reflux disease)   . Staph infection 1986    AFTER C/S 39 YEARS AGO  . Headache     used to have migraines but no longer  . Breast cancer     metastasized to liver  . Anemia     blood transfusion 40 years ago    PAST SURGICAL HISTORY: Reviewed. Past Surgical History  Procedure Laterality Date  . Breast lumpectomy with axillary lymph node biopsy    . Portacath placement  04/13/14  . Cesarean section  08/06/75, 12/11/78     X2  . Breast surgery Right 03/06/14    partial mastectomy  . Mastectomy modified radical Right 09/15/2014    Procedure: MASTECTOMY MODIFIED RADICAL;  Surgeon: Robert Bellow, MD;  Location: [facility] ORS;  Service: General;  Laterality: Right;    FAMILY HISTORY: Reviewed. Family History  Problem Relation Age of Onset  . Cancer Mother     COLON  .  Cancer Maternal Grandfather     COLON  . Melanoma Sister     SOCIAL HISTORY: Reviewed. Social History  Substance Use Topics  . Smoking status: Never Smoker   . Smokeless tobacco: Never Used  . Alcohol Use: No    Allergies  Allergen Reactions  . Aspirin     RINGING EAR.  Marland Kitchen Perjeta [Pertuzumab] Hypertension    Pt developed chills and hypertension  towards end of tx, Dr Grayland Ormond stopped tx and medicated with benadyrl and solucortef  . Band-Aid Plus Antibiotic [Bacitracin-Polymyxin B] Rash    WHEN APPLIED TO CHEST AREA  . Pseudoephedrine Palpitations    Current Outpatient Prescriptions  Medication Sig Dispense Refill  . famotidine (PEPCID AC) 10 MG chewable tablet Chew 10 mg by mouth 2 (two) times daily.    . fentaNYL (DURAGESIC - DOSED MCG/HR) 12 MCG/HR Place 1 patch (12.5 mcg total) onto the skin every 3 (three) days. 10 patch 0  . HYDROcodone-acetaminophen (NORCO) 5-325 MG per tablet Take 1-2 tablets by mouth every 4 (four) hours as needed for moderate pain. 90 tablet 0  . Multiple Vitamin (MULTIVITAMIN) capsule Take 1 capsule by mouth daily.    . promethazine (PHENERGAN) 25 MG tablet Take 25 mg by mouth every 4 (four) hours as needed.   2   No current facility-administered medications for this visit.    PHYSICAL EXAM: Filed Vitals:   10/10/14 1117  BP: 109/73  Pulse: 124  Temp: 98.3 F (36.8 C)  Resp: 20     There is no weight on file to calculate BMI.    ECOG FS:2 - Symptomatic, <50% confined to bed  GENERAL: Patient is weak and tired looking, otherwise alert and oriented and in no acute distress. Icterus. HEENT: Extraocular movements intact. No oral thrush, mouth is dry LUNGS: Bilaterally clear to auscultation, no rhonchi. ABDOMEN: Soft, liver disease enlarged, mildly tender.  EXTREMITIES: Trace pedal edema.    LAB RESULTS:    Component Value Date/Time   NA 136 09/11/2014 1518   NA 138 06/16/2014 0918   K 4.0 09/11/2014 1518   K 3.8 06/16/2014 0918   CL 102 09/11/2014 1518   CL 104 06/16/2014 0918   CO2 28 09/11/2014 1518   CO2 26 06/16/2014 0918   GLUCOSE 111* 09/11/2014 1518   GLUCOSE 120* 06/16/2014 0918   BUN 12 09/11/2014 1518   BUN 10 06/16/2014 0918   CREATININE 0.61 10/05/2014 0950   CREATININE 0.60 06/16/2014 0918   CREATININE 0.68 01/12/2014 0842   CALCIUM 8.7* 10/05/2014 0950   CALCIUM 9.3  06/16/2014 0918   PROT 7.3 09/11/2014 1518   PROT 7.4 06/16/2014 0918   ALBUMIN 3.6 09/11/2014 1518   ALBUMIN 4.0 06/16/2014 0918   AST 50* 09/11/2014 1518   AST 25 06/16/2014 0918   ALT 35 09/11/2014 1518   ALT 17 06/16/2014 0918   ALKPHOS 175* 09/11/2014 1518   ALKPHOS 84 06/16/2014 0918   BILITOT 0.4 09/11/2014 1518   BILITOT 1.0 06/16/2014 0918   GFRNONAA >60 10/05/2014 0950   GFRNONAA >60 06/16/2014 0918   GFRAA >60 10/05/2014 0950   GFRAA >60 06/16/2014 0918    Lab Results  Component Value Date   WBC 9.1 10/05/2014   NEUTROABS 7.7* 10/05/2014   HGB 11.2* 10/05/2014   HCT 33.4* 10/05/2014   MCV 93.9 10/05/2014   PLT 244 10/05/2014    STUDIES: 09/29/14 - PET Scan. IMPRESSION: Hypermetabolic mediastinal and hilar lymphadenopathy. Tiny bilateral pulmonary metastases. Some of the larger  metastases demonstrate hypermetabolic FDG accumulation on PET imaging. Bulky hypermetabolic liver metastases. Hypermetabolic bone metastases within the axial skeleton.  STAGING: Breast cancer, right breast   Staging form: Breast, AJCC 7th Edition     Clinical: Stage IV (TX, NX, M1) - Signed by Chauncey Cruel, MD on 07/07/2014.    ASSESSMENT / PLAN:   1. 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 Oncotype DX test on 03/17/14 reported very high score of 53, 10-year risk of distant recurrence is greater than 30%). Started adjuvant chemotherapy with Taxotere/Cytoxan on 04/17/14. Has stage IV disease given that she has liver metastasis, and right axillary node along with right lateral subcutaneous breast mass being positive for malignancy on biopsy done 05/31/14. Liver bx at [facility] positive for metastatic disease. Recent PET scan shows rapid  progresive liver mets alongwith mediastinal adenopathy, bone mets. Started palliative Navelbine/Herceptin/Perjeta on 10/03/14  -  patient having nausea and other GI symptoms as described above, decreased oral fluid intake and clinical dehydration. Plan is to give antiemetics with IV Zofran plus Decadron along with 2 L IV fluid half normal saline over 4 hours today and repeat this tomorrow if she is still symptomatic and not eating and drinking well. Have advised her to increase Pepcid to twice a day dosing, use simethicone 3 times a day when necessary for gas. She was otherwise advised to keep previously scheduled appointments the same.   2. Pain - fentanyl 12 g per hour patch was added yesterday, continue to monitor. Patient advised that if she continues to use frequent breakthrough pain medication, then to increase fentanyl dose to 2 patches (25 g) starting August 18 onwards.  3. In between visits, the patient has been advised to call or come to the ER in case of fevers, bleeding, acute sickness or new symptoms. She is agreeable to this plan.     Leia Alf, MD   10/10/2014 11:50 AM

## 2014-10-10 NOTE — Progress Notes (Signed)
Patient ID: Jenna Pitts, female   DOB: Feb 20, 1951, 64 y.o.   MRN: [id number]  Chief Complaint  Patient presents with  . Routine Post Op    HPI Jenna Pitts is a 64 y.o. female.  Here today for postoperative visit, right mastectomy on 09-15-14. She states she is having indigestion and mild abdominal pain. Her urine is much darker. Her daughter stats she noticed her color in skin change yesterday. Patient states she "just feels bad". She states she "feels hot" but no fevers. She had chemotherapy last Thursday. She has been heaving.  HPI  Past Medical History  Diagnosis Date  . Blood transfusion without reported diagnosis [place]  . GERD (gastroesophageal reflux disease)   . Staph infection 1986    AFTER C/S 39 YEARS AGO  . Headache     used to have migraines but no longer  . Breast cancer     metastasized to liver  . Anemia     blood transfusion 40 years ago    Past Surgical History  Procedure Laterality Date  . Breast lumpectomy with axillary lymph node biopsy    . Portacath placement  04/13/14  . Cesarean section  08/06/75, 12/11/78     X2  . Breast surgery Right 03/06/14    partial mastectomy  . Mastectomy modified radical Right 09/15/2014    Procedure: MASTECTOMY MODIFIED RADICAL;  Surgeon: Robert Bellow, MD;  Location: [facility] ORS;  Service: General;  Laterality: Right;    Family History  Problem Relation Age of Onset  . Cancer Mother     COLON  . Cancer Maternal Grandfather     COLON  . Melanoma Sister     Social History Social History  Substance Use Topics  . Smoking status: Never Smoker   . Smokeless tobacco: Never Used  . Alcohol Use: No    Allergies  Allergen Reactions  . Aspirin     RINGING EAR.  Marland Kitchen Perjeta [Pertuzumab] Hypertension    Pt developed chills and hypertension towards end of tx, Dr Grayland Ormond stopped tx and medicated with benadyrl and solucortef  . Band-Aid Plus Antibiotic [Bacitracin-Polymyxin B] Rash    WHEN APPLIED TO  CHEST AREA  . Pseudoephedrine Palpitations    Current Outpatient Prescriptions  Medication Sig Dispense Refill  . fentaNYL (DURAGESIC - DOSED MCG/HR) 12 MCG/HR Place 1 patch (12.5 mcg total) onto the skin every 3 (three) days. 10 patch 0  . HYDROcodone-acetaminophen (NORCO) 5-325 MG per tablet Take 1-2 tablets by mouth every 4 (four) hours as needed for moderate pain. 90 tablet 0  . promethazine (PHENERGAN) 25 MG tablet Take 25 mg by mouth every 4 (four) hours as needed.   2  . famotidine (PEPCID AC) 10 MG chewable tablet Chew 10 mg by mouth 2 (two) times daily.    . Multiple Vitamin (MULTIVITAMIN) capsule Take 1 capsule by mouth daily.    . ondansetron (ZOFRAN ODT) 4 MG disintegrating tablet Take 1 tablet (4 mg total) by mouth every 4 (four) hours as needed for nausea or vomiting. 60 tablet 1   No current facility-administered medications for this visit.    Review of Systems Review of Systems  Constitutional: Negative.   Respiratory: Positive for cough.   Cardiovascular: Negative.   Gastrointestinal: Positive for abdominal pain.    Blood pressure 134/82, pulse 122, temperature 97.1 F (36.2 C), temperature source Oral, resp. rate 14, height 5\' 6"  (1.676 m), weight 171 lb (77.565 kg), SpO2 93 %.  Physical Exam Physical Exam  Constitutional: She is oriented to person, place, and time. She appears well-developed and well-nourished.  Neck: Neck supple.  Pulmonary/Chest:       Abdominal: There is tenderness in the right upper quadrant.  Musculoskeletal:  Good range of motion  Neurological: She is alert and oriented to person, place, and time.  Skin: Skin is warm.  Pt is Jaundiced  Psychiatric: Her behavior is normal.    Data Reviewed Recent PET scan showed massive involvement of the right lobe of the liver with metastatic cancer.  Assessment    Interval development of jaundice: Tumor lysis more likely than obstruction.    Plan    The case was reviewed with Dr.  Ma Hillock. The patient will be seen at the [facility] cancer center this morning for labs and fluid administration.        PCP:  Myles Rosenthal 10/10/2014, 6:35 PM

## 2014-10-11 ENCOUNTER — Inpatient Hospital Stay: Payer: BLUE CROSS/BLUE SHIELD

## 2014-10-11 VITALS — BP 145/83 | HR 120 | Temp 96.6°F | Resp 18

## 2014-10-11 DIAGNOSIS — C50911 Malignant neoplasm of unspecified site of right female breast: Secondary | ICD-10-CM | POA: Diagnosis not present

## 2014-10-11 MED ORDER — SODIUM CHLORIDE 0.9 % IV SOLN
Freq: Once | INTRAVENOUS | Status: AC
  Administered 2014-10-11: 11:00:00 via INTRAVENOUS
  Filled 2014-10-11: qty 4

## 2014-10-11 MED ORDER — SODIUM CHLORIDE 0.45 % IV SOLN
Freq: Once | INTRAVENOUS | Status: AC
  Administered 2014-10-11: 10:00:00 via INTRAVENOUS
  Filled 2014-10-11: qty 1000

## 2014-10-12 ENCOUNTER — Inpatient Hospital Stay: Payer: BLUE CROSS/BLUE SHIELD

## 2014-10-12 VITALS — BP 138/84 | HR 109 | Temp 97.4°F | Resp 18

## 2014-10-12 DIAGNOSIS — C50911 Malignant neoplasm of unspecified site of right female breast: Secondary | ICD-10-CM | POA: Diagnosis not present

## 2014-10-12 LAB — HEPATIC FUNCTION PANEL
ALK PHOS: 1269 U/L — AB (ref 38–126)
ALT: 134 U/L — AB (ref 14–54)
AST: 274 U/L — ABNORMAL HIGH (ref 15–41)
Albumin: 2.4 g/dL — ABNORMAL LOW (ref 3.5–5.0)
BILIRUBIN DIRECT: 1.7 mg/dL — AB (ref 0.1–0.5)
BILIRUBIN INDIRECT: 1.2 mg/dL — AB (ref 0.3–0.9)
Total Bilirubin: 2.9 mg/dL — ABNORMAL HIGH (ref 0.3–1.2)
Total Protein: 6.4 g/dL — ABNORMAL LOW (ref 6.5–8.1)

## 2014-10-12 LAB — CBC WITH DIFFERENTIAL/PLATELET
BASOS ABS: 0 10*3/uL (ref 0–0.1)
Basophils Relative: 0 %
EOS PCT: 0 %
Eosinophils Absolute: 0 10*3/uL (ref 0–0.7)
HCT: 28.8 % — ABNORMAL LOW (ref 35.0–47.0)
Hemoglobin: 9.6 g/dL — ABNORMAL LOW (ref 12.0–16.0)
LYMPHS PCT: 7 %
Lymphs Abs: 0.4 10*3/uL — ABNORMAL LOW (ref 1.0–3.6)
MCH: 31.1 pg (ref 26.0–34.0)
MCHC: 33.3 g/dL (ref 32.0–36.0)
MCV: 93.3 fL (ref 80.0–100.0)
MONO ABS: 1 10*3/uL — AB (ref 0.2–0.9)
Monocytes Relative: 18 %
Neutro Abs: 4.3 10*3/uL (ref 1.4–6.5)
Neutrophils Relative %: 75 %
PLATELETS: 263 10*3/uL (ref 150–440)
RBC: 3.08 MIL/uL — ABNORMAL LOW (ref 3.80–5.20)
RDW: 19.4 % — AB (ref 11.5–14.5)
WBC: 5.8 10*3/uL (ref 3.6–11.0)

## 2014-10-12 LAB — BASIC METABOLIC PANEL
Anion gap: 17 — ABNORMAL HIGH (ref 5–15)
BUN: 14 mg/dL (ref 6–20)
CALCIUM: 7.5 mg/dL — AB (ref 8.9–10.3)
CO2: 21 mmol/L — ABNORMAL LOW (ref 22–32)
Chloride: 93 mmol/L — ABNORMAL LOW (ref 101–111)
Creatinine, Ser: 0.52 mg/dL (ref 0.44–1.00)
GFR calc Af Amer: >60 mL/min (ref >60)
GLUCOSE: 104 mg/dL — AB (ref 65–99)
Potassium: 3.6 mmol/L (ref 3.5–5.1)
Sodium: 131 mmol/L — ABNORMAL LOW (ref 135–145)

## 2014-10-12 MED ORDER — PROCHLORPERAZINE MALEATE 10 MG PO TABS
10.0000 mg | ORAL_TABLET | Freq: Once | ORAL | Status: AC
  Administered 2014-10-12: 10 mg via ORAL
  Filled 2014-10-12: qty 1

## 2014-10-12 MED ORDER — SODIUM CHLORIDE 0.9 % IJ SOLN
10.0000 mL | INTRAMUSCULAR | Status: DC | PRN
  Administered 2014-10-12: 10 mL
  Filled 2014-10-12: qty 10

## 2014-10-12 MED ORDER — HEPARIN SOD (PORK) LOCK FLUSH 100 UNIT/ML IV SOLN
500.0000 [IU] | Freq: Once | INTRAVENOUS | Status: AC | PRN
  Administered 2014-10-12: 500 [IU]
  Filled 2014-10-12: qty 5

## 2014-10-12 MED ORDER — SODIUM CHLORIDE 0.9 % IV SOLN
Freq: Once | INTRAVENOUS | Status: AC
  Administered 2014-10-12: 12:00:00 via INTRAVENOUS
  Filled 2014-10-12: qty 1000

## 2014-10-12 MED ORDER — VINORELBINE TARTRATE CHEMO INJECTION 50 MG/5ML
30.0000 mg/m2 | Freq: Once | INTRAVENOUS | Status: AC
  Administered 2014-10-12: 57 mg via INTRAVENOUS
  Filled 2014-10-12: qty 5

## 2014-10-13 ENCOUNTER — Other Ambulatory Visit: Payer: Self-pay | Admitting: Internal Medicine

## 2014-10-13 DIAGNOSIS — C50911 Malignant neoplasm of unspecified site of right female breast: Secondary | ICD-10-CM

## 2014-10-13 MED ORDER — MORPHINE SULFATE (CONCENTRATE) 20 MG/ML PO SOLN: ORAL | Status: DC

## 2014-10-16 ENCOUNTER — Other Ambulatory Visit: Payer: BLUE CROSS/BLUE SHIELD

## 2014-10-16 ENCOUNTER — Telehealth: Payer: Self-pay | Admitting: *Deleted

## 2014-10-16 MED ORDER — LACTULOSE 20 GM/30ML PO SOLN: 30.0000 mL | Freq: Three times a day (TID) | ORAL | Status: AC | PRN

## 2014-10-16 NOTE — Telephone Encounter (Signed)
Pt called this weekend and asked about pain control with morphine.  Pt took morphine every 1 hour for about 12 hours and got pain level down to 1 -2., but she could not get up due to dizziness, weakness in the legs from all the pain med.  Pt wanted to know what is the goal of morphine and how long will she take it.  I told her that we will use it as long as she is getting pain control managed.  We do not usually see pain free but the goal is medication keeping pain at tolerable level for patient.  She wants to know if she side effects she is having is from chemotherapy and speaking to pandit the answer is probably not-the most pain is coming for liver mets and until she has about 3 cycles of herceptin which is given every 3 weeks we will probably know whether the chemo has kicked and working on liver mets.  She also having lots of constipation and she is taking 2 stool softners a day and miralax daily and no BM in 4 days.  I spoke to pandit and he said to use senokot s 2 bid, and then to call in lactulose 30 ml tid as needed for BM.  I asked if pt felt like the fluids she rcvd last week helped her feel any better and she said no.  She would stay at home and drink liq. And eat frequent small meals and call if she needs anything.  I electronically sent  in lactulose for pt to her pharmacy.

## 2014-10-17 ENCOUNTER — Inpatient Hospital Stay
Admission: EM | Admit: 2014-10-17 | Discharge: 2014-10-21 | DRG: 435 | Disposition: A | Discharge status: Hospice | Payer: BLUE CROSS/BLUE SHIELD | Attending: Internal Medicine | Admitting: Internal Medicine

## 2014-10-17 ENCOUNTER — Telehealth: Payer: Self-pay | Admitting: *Deleted

## 2014-10-17 ENCOUNTER — Other Ambulatory Visit: Payer: Self-pay

## 2014-10-17 ENCOUNTER — Encounter: Payer: Self-pay | Admitting: Emergency Medicine

## 2014-10-17 DIAGNOSIS — C7800 Secondary malignant neoplasm of unspecified lung: Secondary | ICD-10-CM | POA: Diagnosis present

## 2014-10-17 DIAGNOSIS — J811 Chronic pulmonary edema: Secondary | ICD-10-CM | POA: Diagnosis present

## 2014-10-17 DIAGNOSIS — T451X5A Adverse effect of antineoplastic and immunosuppressive drugs, initial encounter: Secondary | ICD-10-CM | POA: Diagnosis present

## 2014-10-17 DIAGNOSIS — C50411 Malignant neoplasm of upper-outer quadrant of right female breast: Secondary | ICD-10-CM

## 2014-10-17 DIAGNOSIS — C7951 Secondary malignant neoplasm of bone: Secondary | ICD-10-CM | POA: Diagnosis not present

## 2014-10-17 DIAGNOSIS — J90 Pleural effusion, not elsewhere classified: Secondary | ICD-10-CM | POA: Diagnosis present

## 2014-10-17 DIAGNOSIS — C50919 Malignant neoplasm of unspecified site of unspecified female breast: Secondary | ICD-10-CM | POA: Diagnosis present

## 2014-10-17 DIAGNOSIS — Z66 Do not resuscitate: Secondary | ICD-10-CM | POA: Diagnosis present

## 2014-10-17 DIAGNOSIS — R5381 Other malaise: Secondary | ICD-10-CM

## 2014-10-17 DIAGNOSIS — E871 Hypo-osmolality and hyponatremia: Secondary | ICD-10-CM | POA: Diagnosis present

## 2014-10-17 DIAGNOSIS — R17 Unspecified jaundice: Secondary | ICD-10-CM

## 2014-10-17 DIAGNOSIS — E8809 Other disorders of plasma-protein metabolism, not elsewhere classified: Secondary | ICD-10-CM | POA: Diagnosis present

## 2014-10-17 DIAGNOSIS — K219 Gastro-esophageal reflux disease without esophagitis: Secondary | ICD-10-CM | POA: Diagnosis present

## 2014-10-17 DIAGNOSIS — E8770 Fluid overload, unspecified: Secondary | ICD-10-CM | POA: Diagnosis present

## 2014-10-17 DIAGNOSIS — Z79899 Other long term (current) drug therapy: Secondary | ICD-10-CM

## 2014-10-17 DIAGNOSIS — D61810 Antineoplastic chemotherapy induced pancytopenia: Secondary | ICD-10-CM | POA: Diagnosis present

## 2014-10-17 DIAGNOSIS — D649 Anemia, unspecified: Secondary | ICD-10-CM

## 2014-10-17 DIAGNOSIS — Z9221 Personal history of antineoplastic chemotherapy: Secondary | ICD-10-CM | POA: Diagnosis not present

## 2014-10-17 DIAGNOSIS — E860 Dehydration: Secondary | ICD-10-CM | POA: Diagnosis present

## 2014-10-17 DIAGNOSIS — Z808 Family history of malignant neoplasm of other organs or systems: Secondary | ICD-10-CM | POA: Diagnosis not present

## 2014-10-17 DIAGNOSIS — Z170 Estrogen receptor positive status [ER+]: Secondary | ICD-10-CM | POA: Diagnosis not present

## 2014-10-17 DIAGNOSIS — R531 Weakness: Secondary | ICD-10-CM

## 2014-10-17 DIAGNOSIS — R000 Tachycardia, unspecified: Secondary | ICD-10-CM

## 2014-10-17 DIAGNOSIS — C787 Secondary malignant neoplasm of liver and intrahepatic bile duct: Secondary | ICD-10-CM | POA: Diagnosis not present

## 2014-10-17 DIAGNOSIS — C50911 Malignant neoplasm of unspecified site of right female breast: Secondary | ICD-10-CM

## 2014-10-17 DIAGNOSIS — C799 Secondary malignant neoplasm of unspecified site: Secondary | ICD-10-CM

## 2014-10-17 DIAGNOSIS — D72819 Decreased white blood cell count, unspecified: Secondary | ICD-10-CM

## 2014-10-17 DIAGNOSIS — G8929 Other chronic pain: Secondary | ICD-10-CM | POA: Diagnosis present

## 2014-10-17 DIAGNOSIS — R0602 Shortness of breath: Secondary | ICD-10-CM

## 2014-10-17 LAB — DIFFERENTIAL
BLASTS: 0 %
Band Neutrophils: 16 % — ABNORMAL HIGH (ref 0–10)
Basophils Absolute: 0 10*3/uL (ref 0–0.1)
Basophils Relative: 0 % (ref 0–1)
Eosinophils Absolute: 0 10*3/uL (ref 0–0.7)
Eosinophils Relative: 0 % (ref 0–5)
LYMPHS PCT: 20 % (ref 12–46)
Lymphs Abs: 0.2 10*3/uL — ABNORMAL LOW (ref 1.0–3.6)
METAMYELOCYTES PCT: 0 %
MONOS PCT: 16 % — AB (ref 3–12)
MYELOCYTES: 0 %
Monocytes Absolute: 0.2 10*3/uL (ref 0.2–0.9)
NEUTROS PCT: 48 % (ref 43–77)
NRBC: 0 /100{WBCs}
Neutro Abs: 0.7 10*3/uL — ABNORMAL LOW (ref 1.4–6.5)
OTHER: 0 %
PROMYELOCYTES ABS: 0 %

## 2014-10-17 LAB — HEPATIC FUNCTION PANEL
ALT: 105 U/L — AB (ref 14–54)
AST: 241 U/L — ABNORMAL HIGH (ref 15–41)
Albumin: 1.9 g/dL — ABNORMAL LOW (ref 3.5–5.0)
Alkaline Phosphatase: 1117 U/L — ABNORMAL HIGH (ref 38–126)
BILIRUBIN DIRECT: 4.8 mg/dL — AB (ref 0.1–0.5)
BILIRUBIN INDIRECT: 1.9 mg/dL — AB (ref 0.3–0.9)
BILIRUBIN TOTAL: 6.7 mg/dL — AB (ref 0.3–1.2)
Total Protein: 5.4 g/dL — ABNORMAL LOW (ref 6.5–8.1)

## 2014-10-17 LAB — CBC
HEMATOCRIT: 23.8 % — AB (ref 35.0–47.0)
Hemoglobin: 7.9 g/dL — ABNORMAL LOW (ref 12.0–16.0)
MCH: 31.1 pg (ref 26.0–34.0)
MCHC: 33 g/dL (ref 32.0–36.0)
MCV: 94.2 fL (ref 80.0–100.0)
PLATELETS: 163 10*3/uL (ref 150–440)
RBC: 2.52 MIL/uL — AB (ref 3.80–5.20)
RDW: 19 % — AB (ref 11.5–14.5)
WBC: 1 10*3/uL — CL (ref 3.6–11.0)

## 2014-10-17 LAB — BASIC METABOLIC PANEL
Anion gap: 17 — ABNORMAL HIGH (ref 5–15)
BUN: 12 mg/dL (ref 6–20)
CHLORIDE: 81 mmol/L — AB (ref 101–111)
CO2: 24 mmol/L (ref 22–32)
CREATININE: 0.46 mg/dL (ref 0.44–1.00)
Calcium: 7.1 mg/dL — ABNORMAL LOW (ref 8.9–10.3)
GFR calc Af Amer: >60 mL/min (ref >60)
GFR calc non Af Amer: >60 mL/min (ref >60)
Glucose, Bld: 119 mg/dL — ABNORMAL HIGH (ref 65–99)
Potassium: 4.5 mmol/L (ref 3.5–5.1)
Sodium: 122 mmol/L — ABNORMAL LOW (ref 135–145)

## 2014-10-17 LAB — URINALYSIS COMPLETE WITH MICROSCOPIC (ARMC ONLY)
BACTERIA UA: NONE SEEN
GLUCOSE, UA: NEGATIVE mg/dL
HGB URINE DIPSTICK: NEGATIVE
KETONES UR: NEGATIVE mg/dL
LEUKOCYTES UA: NEGATIVE
NITRITE: NEGATIVE
Protein, ur: NEGATIVE mg/dL
SPECIFIC GRAVITY, URINE: 1.018 (ref 1.005–1.030)
pH: 5 (ref 5.0–8.0)

## 2014-10-17 MED ORDER — DOCUSATE SODIUM 100 MG PO CAPS
100.0000 mg | ORAL_CAPSULE | Freq: Two times a day (BID) | ORAL | Status: DC
  Administered 2014-10-17 – 2014-10-21 (×4): 100 mg via ORAL
  Filled 2014-10-17 (×7): qty 1

## 2014-10-17 MED ORDER — ONDANSETRON HCL 4 MG/2ML IJ SOLN
4.0000 mg | Freq: Four times a day (QID) | INTRAMUSCULAR | Status: DC | PRN
  Administered 2014-10-17 – 2014-10-20 (×3): 4 mg via INTRAVENOUS
  Filled 2014-10-17 (×3): qty 2

## 2014-10-17 MED ORDER — LACTULOSE 20 GM/30ML PO SOLN: 30.0000 mL | Freq: Three times a day (TID) | ORAL | Status: DC | PRN

## 2014-10-17 MED ORDER — HYDROMORPHONE HCL 1 MG/ML IJ SOLN
2.0000 mg | INTRAMUSCULAR | Status: DC | PRN
Start: 2014-10-17 — End: 2014-10-18
  Administered 2014-10-17 – 2014-10-18 (×2): 2 mg via INTRAVENOUS
  Filled 2014-10-17 (×2): qty 2

## 2014-10-17 MED ORDER — FENTANYL 12 MCG/HR TD PT72: 12.5000 ug | MEDICATED_PATCH | TRANSDERMAL | Status: DC

## 2014-10-17 MED ORDER — SENNA 8.6 MG PO TABS
1.0000 | ORAL_TABLET | Freq: Two times a day (BID) | ORAL | Status: DC
  Administered 2014-10-17 – 2014-10-20 (×2): 8.6 mg via ORAL
  Filled 2014-10-17 (×5): qty 1

## 2014-10-17 MED ORDER — BISACODYL 10 MG RE SUPP: 10.0000 mg | Freq: Every day | RECTAL | Status: DC | PRN

## 2014-10-17 MED ORDER — ONDANSETRON HCL 4 MG/2ML IJ SOLN
4.0000 mg | Freq: Once | INTRAMUSCULAR | Status: AC
  Administered 2014-10-17: 4 mg via INTRAVENOUS
  Filled 2014-10-17: qty 2

## 2014-10-17 MED ORDER — FLEET ENEMA 7-19 GM/118ML RE ENEM: 1.0000 | ENEMA | Freq: Once | RECTAL | Status: DC | PRN

## 2014-10-17 MED ORDER — POLYETHYLENE GLYCOL 3350 17 G PO PACK
17.0000 g | PACK | Freq: Every day | ORAL | Status: DC | PRN
Start: 2014-10-17 — End: 2014-10-21

## 2014-10-17 MED ORDER — METOPROLOL TARTRATE 25 MG PO TABS
25.0000 mg | ORAL_TABLET | ORAL | Status: AC
  Administered 2014-10-17: 25 mg via ORAL
  Filled 2014-10-17: qty 1

## 2014-10-17 MED ORDER — ONDANSETRON HCL 4 MG PO TABS: 4.0000 mg | ORAL_TABLET | Freq: Four times a day (QID) | ORAL | Status: DC | PRN

## 2014-10-17 MED ORDER — METOPROLOL TARTRATE 25 MG PO TABS
25.0000 mg | ORAL_TABLET | Freq: Two times a day (BID) | ORAL | Status: DC
  Administered 2014-10-17 – 2014-10-18 (×3): 25 mg via ORAL
  Filled 2014-10-17 (×3): qty 1

## 2014-10-17 MED ORDER — FENTANYL 25 MCG/HR TD PT72
25.0000 ug | MEDICATED_PATCH | TRANSDERMAL | Status: DC
  Administered 2014-10-20: 16:00:00 25 ug via TRANSDERMAL
  Filled 2014-10-17 (×2): qty 1

## 2014-10-17 MED ORDER — HEPARIN SODIUM (PORCINE) 5000 UNIT/ML IJ SOLN
5000.0000 [IU] | Freq: Three times a day (TID) | INTRAMUSCULAR | Status: DC
  Administered 2014-10-17 – 2014-10-21 (×12): 5000 [IU] via SUBCUTANEOUS
  Filled 2014-10-17 (×12): qty 1

## 2014-10-17 MED ORDER — FENTANYL 50 MCG/HR TD PT72
50.0000 ug | MEDICATED_PATCH | TRANSDERMAL | Status: DC
  Administered 2014-10-17: 25 ug via TRANSDERMAL
  Filled 2014-10-17: qty 2

## 2014-10-17 MED ORDER — SODIUM CHLORIDE 0.9 % IV SOLN
INTRAVENOUS | Status: DC
  Administered 2014-10-17 – 2014-10-19 (×5): via INTRAVENOUS

## 2014-10-17 MED ORDER — LACTULOSE 10 GM/15ML PO SOLN: 20.0000 g | Freq: Three times a day (TID) | ORAL | Status: DC | PRN

## 2014-10-17 MED ORDER — FENTANYL 25 MCG/HR TD PT72: 25.0000 ug | MEDICATED_PATCH | TRANSDERMAL | Status: AC

## 2014-10-17 MED ORDER — HYDROCODONE-ACETAMINOPHEN 5-325 MG PO TABS
1.0000 | ORAL_TABLET | ORAL | Status: DC | PRN
  Administered 2014-10-17 – 2014-10-18 (×2): 1 via ORAL
  Administered 2014-10-18: 2 via ORAL
  Administered 2014-10-18 (×2): 1 via ORAL
  Administered 2014-10-18 – 2014-10-20 (×9): 2 via ORAL
  Filled 2014-10-17 (×2): qty 2
  Filled 2014-10-17: qty 1
  Filled 2014-10-17 (×4): qty 2
  Filled 2014-10-17: qty 1
  Filled 2014-10-17 (×2): qty 2
  Filled 2014-10-17 (×2): qty 1
  Filled 2014-10-17 (×3): qty 2

## 2014-10-17 MED ORDER — FENTANYL CITRATE (PF) 100 MCG/2ML IJ SOLN
50.0000 ug | INTRAMUSCULAR | Status: DC | PRN
  Administered 2014-10-17: 50 ug via INTRAVENOUS
  Filled 2014-10-17: qty 2

## 2014-10-17 MED ORDER — SODIUM CHLORIDE 0.9 % IV BOLUS (SEPSIS)
1000.0000 mL | Freq: Once | INTRAVENOUS | Status: AC
  Administered 2014-10-17: 1000 mL via INTRAVENOUS
  Filled 2014-10-17: qty 1000

## 2014-10-17 NOTE — ED Provider Notes (Signed)
[facility] Emergency Department Provider Note  ____________________________________________  Time seen: 12:45 pm  I have reviewed the triage vital signs and the nursing notes.   HISTORY  Chief Complaint Weakness  nausea    HPI Jenna Pitts is a 64 y.o. female who has stage IV breast cancer. She recently underwent a mastectomy and began chemotherapy. She has metastasis to the liver.  She has had 2 treatments of chemotherapy over the past 2 weeks. Her strength has been worsening. Today she had difficulty walking and ultimately fell. 911 was called and she has been brought to the emergency department.  She was seen last week in oncology. She received IV fluids to try to help with these symptoms then. The family tells me this did not help. Her jaundice is worsening. She does have ongoing nausea.  Currently, she is having increased pain on and she has not taken her regular pain medicine since she arrived here at the emergency department.    Past Medical History  Diagnosis Date  . Blood transfusion without reported diagnosis [place]  . GERD (gastroesophageal reflux disease)   . Staph infection 1986    AFTER C/S 39 YEARS AGO  . Headache     used to have migraines but no longer  . Breast cancer     metastasized to liver  . Anemia     blood transfusion 40 years ago    Patient Active Problem List   Diagnosis Date Noted  . Jaundice 10/10/2014  . Wound cellulitis 09/21/2014  . Primary cancer of right breast with metastasis to other site 09/06/2014  . Breast cancer, right breast 07/07/2014  . Breast CA 03/01/2014  . CHICKENPOX, HX OF 05/01/2010  . COMMON MIGRAINE 04/03/2010    Past Surgical History  Procedure Laterality Date  . Breast lumpectomy with axillary lymph node biopsy    . Portacath placement  04/13/14  . Cesarean section  08/06/75, 12/11/78     X2  . Breast surgery Right 03/06/14    partial mastectomy  . Mastectomy modified  radical Right 09/15/2014    Procedure: MASTECTOMY MODIFIED RADICAL;  Surgeon: Robert Bellow, MD;  Location: [facility] ORS;  Service: General;  Laterality: Right;    Current Outpatient Rx  Name  Route  Sig  Dispense  Refill  . fentaNYL (DURAGESIC - DOSED MCG/HR) 12 MCG/HR   Transdermal   Place 1 patch (12.5 mcg total) onto the skin every 3 (three) days.   10 patch   0   . HYDROcodone-acetaminophen (NORCO/VICODIN) 5-325 MG per tablet   Oral   Take 1-2 tablets by mouth every 4 (four) hours as needed.      0   . morphine (ROXANOL) 20 MG/ML concentrated solution      Take 10 - 20 mg (0.5 - 1 mL) orally once every 1 - 2 hours as needed for pain.   30 mL   0   . ondansetron (ZOFRAN ODT) 4 MG disintegrating tablet   Oral   Take 1 tablet (4 mg total) by mouth every 4 (four) hours as needed for nausea or vomiting.   60 tablet   1   . famotidine (PEPCID AC) 10 MG chewable tablet   Oral   Chew 10 mg by mouth 2 (two) times daily.         . Lactulose 20 GM/30ML SOLN   Oral   Take 30 mLs (20 g total) by mouth 3 (three) times  daily as needed.   480 mL   1   . Multiple Vitamin (MULTIVITAMIN) capsule   Oral   Take 1 capsule by mouth daily.         . promethazine (PHENERGAN) 25 MG tablet   Oral   Take 25 mg by mouth every 4 (four) hours as needed.       2     Allergies Aspirin; Perjeta; Band-aid plus antibiotic; and Pseudoephedrine  Family History  Problem Relation Age of Onset  . Cancer Mother     COLON  . Cancer Maternal Grandfather     COLON  . Melanoma Sister     Social History Social History  Substance Use Topics  . Smoking status: Never Smoker   . Smokeless tobacco: Never Used  . Alcohol Use: No    Review of Systems  Constitutional: Negative for fever. ENT: Negative for sore throat. Cardiovascular: Negative for chest pain. Noted tachycardia. Respiratory: Negative for shortness of breath. Gastrointestinal: Diagnosis of metastasis to the liver.  Nausea. Genitourinary: Negative for dysuria. Musculoskeletal: No myalgias or injuries. Skin: Family and patient reports her jaundice is worsening. Neurological: Negative for headaches Oncological stage IV breast cancer  10-point ROS otherwise negative.  ____________________________________________   PHYSICAL EXAM:  VITAL SIGNS: ED Triage Vitals  Enc Vitals Group     BP 10/17/14 1120 145/77 mmHg     Pulse Rate 10/17/14 1120 130     Resp 10/17/14 1120 18     Temp 10/17/14 1120 98.3 F (36.8 C)     Temp Source 10/17/14 1120 Oral     SpO2 10/17/14 1120 94 %     Weight 10/17/14 1120 184 lb 9 oz (83.717 kg)     Height 10/17/14 1120 5\' 6"  (1.676 m)     Head Cir --      Peak Flow --      Pain Score 10/17/14 1124 6     Pain Loc --      Pain Edu? --      Excl. in [place]? --     Constitutional:  Alert, but ill-appearing 64 year old female with notable jaundice. She appears weak.Marland Kitchen ENT   Head: Normocephalic and atraumatic.   Nose: No congestion/rhinnorhea.   Mouth/Throat: Mucous membranes are moist. Cardiovascular: Tachycardic at 130, regular rhythm, no murmur noted Respiratory:  Normal respiratory effort, no tachypnea.    Breath sounds are clear and equal bilaterally.  Gastrointestinal: Soft. Some tenderness in the right upper quadrant No distention.  Back: No muscle spasm, no tenderness, no CVA tenderness. Musculoskeletal: No deformity noted. Nontender with normal range of motion in all extremities. Mild 1+ peripheral edema in ankles  Neurologic:  Normal speech and language. No gross focal neurologic deficits are appreciated.  Skin:  Skin is warm, dry. No rash noted. Psychiatric: Mood and affect are normal. Speech and behavior are normal.  ____________________________________________    LABS (pertinent positives/negatives)  Labs Reviewed  BASIC METABOLIC PANEL - Abnormal; Notable for the following:    Sodium 122 (*)    Chloride 81 (*)    Glucose, Bld 119 (*)     Calcium 7.1 (*)    Anion gap 17 (*)    All other components within normal limits  CBC - Abnormal; Notable for the following:    WBC 1.0 (*)    RBC 2.52 (*)    Hemoglobin 7.9 (*)    HCT 23.8 (*)    RDW 19.0 (*)    All other components within normal  limits  HEPATIC FUNCTION PANEL - Abnormal; Notable for the following:    Total Protein 5.4 (*)    Albumin 1.9 (*)    AST 241 (*)    ALT 105 (*)    Alkaline Phosphatase 1117 (*)    Total Bilirubin 6.7 (*)    Bilirubin, Direct 4.8 (*)    Indirect Bilirubin 1.9 (*)    All other components within normal limits  CULTURE, BLOOD (ROUTINE X 2)  CULTURE, BLOOD (ROUTINE X 2)  URINALYSIS COMPLETEWITH MICROSCOPIC ([facility] ONLY)  CBG MONITORING, ED     ____________________________________________   EKG  ED ECG REPORT I, Georgia Baria W, the attending physician, personally viewed and interpreted this ECG.   Date: 10/17/2014  EKG Time: 11:20 AM  Rate: 130  Rhythm: Sinus tachycardia  Axis: Normal  Intervals: Normal  ST&T Change: None noted   ____________________________________________    INITIAL IMPRESSION / ASSESSMENT AND PLAN / ED COURSE  Pertinent labs & imaging results that were available during my care of the patient were reviewed by me and considered in my medical decision making (see chart for details).  Ill-appearing 64 year old female with stage IV breast cancer. She has notable jaundice. She appears weak. She is tachycardic. We will treat her tachycardia with IV fluids. 1 L normal saline. She has ongoing pain and nausea. She has fentanyl patches on. At this time I think using fentanyl IV would be appropriate as well. She'll receive 50 g IV and Zofran 4 mg. We will speak with her oncologist, Dr. Ma Hillock, and the internal medicine service to seek admission to the hospital.  ----------------------------------------- 2:25 PM on 10/17/2014 -----------------------------------------  I have discussed the case directly with Dr.  Ma Hillock and with Dr. Tressia Miners.  ____________________________________________   FINAL CLINICAL IMPRESSION(S) / ED DIAGNOSES  Final diagnoses:  Leukopenia  Breast cancer, unspecified laterality  Hyperbilirubinemia  Metastatic disease  Weakness  Tachycardia       Ahmed Prima, MD 10/17/14 1433

## 2014-10-17 NOTE — Plan of Care (Signed)
Problem: Discharge Progression Outcomes Goal: Other Discharge Outcomes/Goals Plan of care progress to goal: Pt admitted for weakness

## 2014-10-17 NOTE — ED Notes (Signed)
Weakness progressive for about 6 weeks.  On chemo.  Has port.  Pt alert and oriented n ow.  nad

## 2014-10-17 NOTE — H&P (Signed)
[facility] NAME: Jenna Pitts    MR#:  [id number]  DATE OF BIRTH:  [birth year]  DATE OF ADMISSION:  10/17/2014  PRIMARY CARE PHYSICIAN: Ria Bush, MD   REQUESTING/REFERRING PHYSICIAN: Dr. Thomasene Lot  CHIEF COMPLAINT:   Chief Complaint  Patient presents with  . Weakness    HISTORY OF PRESENT ILLNESS:  Jenna Pitts  is a 64 y.o. 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 Her breast cancer was first diagnosed in December 2015 following which she had lumpectomy, chemotherapy and radiation. She had's progress to worsening of the disease on the same side and repeat PET scan showing disease spread to liver and lungs and bone in July 2016. Patient had radical mastectomy on the right side. She is following with Dr. pain and it then was started on chemotherapy with Navelbine/Herceptin/perjeta 2 weeks ago. No Neupogen or Neulasta received following chemotherapy. She had her second dose of navelbine 5 days ago. Her labs from 3 days ago showing significant worsening of her LFTs and also bilirubin. Patient has been having on and off nausea since then. She's been dehydrated requiring IV fluid administration in the office twice in the last week. She is unable to even walk around in the house due to worsening back pain. She was started on fentanyl patch, asked to increase the dose and also started on Roxanol as outpatient in spite of which she is having worsening back pain. Today she is noted to have a sodium of 122, elevated LFTs and decreased WBC to 1K. Hemoglobin has dropped down to 7.9. So she is being admitted for the above-mentioned complaints.  PAST MEDICAL  HISTORY:   Past Medical History  Diagnosis Date  . Blood transfusion without reported diagnosis [place]  . GERD (gastroesophageal reflux disease)   . Staph infection 1986    AFTER C/S 39 YEARS AGO  . Headache     used to have migraines but no longer  . Breast cancer     metastasized to liver, lung and bone  . Anemia     blood transfusion 40 years ago    PAST SURGICAL HISTORY:   Past Surgical History  Procedure Laterality Date  . Breast lumpectomy with axillary lymph node biopsy    . Portacath placement  04/13/14  . Cesarean section  08/06/75, 12/11/78     X2  . Breast surgery Right 03/06/14    partial mastectomy  . Mastectomy modified radical Right 09/15/2014    right, Procedure: MASTECTOMY MODIFIED RADICAL;  Surgeon: Robert Bellow, MD;  Location: [facility] ORS;  Service: General;  Laterality: Right;    SOCIAL HISTORY:   Social History  Substance Use Topics  . Smoking status: Never Smoker   . Smokeless tobacco: Never Used  . Alcohol Use: No    FAMILY HISTORY:   Family History  Problem Relation Age of Onset  . Cancer Mother     COLON  . Cancer Maternal Grandfather     COLON  . Melanoma Sister     DRUG ALLERGIES:   Allergies  Allergen Reactions  . Aspirin Other (See Comments)    Reaction:  Ringing ears   . Perjeta [Pertuzumab] Other (See  Comments) and Hypertension    Pt developed chills and hypertension towards end of tx, Dr Grayland Ormond stopped tx and medicated with Benadyrl and Solucortef.    . Band-Aid Plus Antibiotic [Bacitracin-Polymyxin B] Rash and Other (See Comments)    Pt states this only happens when applied to her chest area.    . Pseudoephedrine Palpitations    REVIEW OF SYSTEMS:   Review of Systems  Constitutional: Positive for malaise/fatigue. Negative for fever, chills and weight loss.  HENT: Negative for ear discharge, ear pain, hearing loss, nosebleeds and tinnitus.   Eyes: Negative for blurred vision, double vision and  photophobia.  Respiratory: Negative for cough, hemoptysis, shortness of breath and wheezing.   Cardiovascular: Positive for leg swelling. Negative for chest pain, palpitations and orthopnea.  Gastrointestinal: Positive for abdominal pain and constipation. Negative for heartburn, nausea, vomiting, diarrhea and melena.  Genitourinary: Negative for dysuria, urgency, frequency and hematuria.  Musculoskeletal: Positive for myalgias and back pain. Negative for neck pain.  Skin: Positive for rash.  Neurological: Positive for weakness and headaches. Negative for dizziness, tingling, tremors, sensory change, speech change and focal weakness.  Endo/Heme/Allergies: Does not bruise/bleed easily.  Psychiatric/Behavioral: Negative for depression.    MEDICATIONS AT HOME:   Prior to Admission medications   Medication Sig Start Date End Date Taking? Authorizing Provider  fentaNYL (DURAGESIC - DOSED MCG/HR) 12 MCG/HR Place 1 patch (12.5 mcg total) onto the skin every 3 (three) days. 10/09/14  Yes Leia Alf, MD  HYDROcodone-acetaminophen (NORCO/VICODIN) 5-325 MG per tablet Take 1-2 tablets by mouth every 4 (four) hours as needed for moderate pain.    Yes Historical Provider, MD  morphine (ROXANOL) 20 MG/ML concentrated solution Take 10 - 20 mg (0.5 - 1 mL) orally once every 1 - 2 hours as needed for pain. Patient taking differently: Take 10-20 mg by mouth every hour as needed for severe pain.  10/13/14  Yes Leia Alf, MD  ondansetron (ZOFRAN ODT) 4 MG disintegrating tablet Take 1 tablet (4 mg total) by mouth every 4 (four) hours as needed for nausea or vomiting. 10/10/14  Yes Leia Alf, MD  Lactulose 20 GM/30ML SOLN Take 30 mLs (20 g total) by mouth 3 (three) times daily as needed. Patient not taking: Reported on 10/17/2014 10/16/14   Leia Alf, MD      VITAL SIGNS:  Blood pressure 137/71, pulse 121, temperature 98.3 F (36.8 C), temperature source Oral, resp. rate 23, height 5\' 6"  (1.676 m),  weight 83.717 kg (184 lb 9 oz), SpO2 91 %.  PHYSICAL EXAMINATION:   Physical Exam  GENERAL:  64 y.o.-year-old patient ill appearing lying in the bed.  EYES: Pupils equal, round, reactive to light and accommodation. significant scleral icterus. Extraocular muscles intact.  HEENT: Head atraumatic, normocephalic. Oropharynx and nasopharynx clear.  Dry mucous membranes. Non blanching macular  rash present on face. NECK:  Supple, no jugular venous distention. No thyroid enlargement, no tenderness.  LUNGS: Normal breath sounds bilaterally, decreased bibasilar breath sounds. no wheezing, rales,rhonchi or crepitation. No use of accessory muscles of respiration.  CARDIOVASCULAR: S1, S2 normal. No murmurs, rubs, or gallops.  ABDOMEN: Soft, but distended. Tenderness in RUQ, voluntary guarding, no rigidity or rebound tenderness. Bowel sounds present. No organomegaly or mass.  EXTREMITIES: 2+ pedal edema, No cyanosis, or clubbing.  NEUROLOGIC: Cranial nerves II through XII are intact. Muscle strength 5/5 in all extremities. Sensation intact. Gait not checked.  PSYCHIATRIC: The patient is alert and oriented x 3.  SKIN: No obvious rash,  lesion, or ulcer. Yellowish appearing skin  LABORATORY PANEL:   CBC  Recent Labs Lab 10/17/14 1136  WBC 1.0*  HGB 7.9*  HCT 23.8*  PLT 163   ------------------------------------------------------------------------------------------------------------------  Chemistries   Recent Labs Lab 10/17/14 1136  NA 122*  K 4.5  CL 81*  CO2 24  GLUCOSE 119*  BUN 12  CREATININE 0.46  CALCIUM 7.1*  AST 241*  ALT 105*  ALKPHOS 1117*  BILITOT 6.7*   ------------------------------------------------------------------------------------------------------------------  Cardiac Enzymes No results for input(s): TROPONINI in the last 168  hours. ------------------------------------------------------------------------------------------------------------------  RADIOLOGY:  No results found.  EKG:   Orders placed or performed during the hospital encounter of 10/17/14  . ED EKG  . ED EKG    IMPRESSION AND PLAN:   Jenna Pitts  is a 64 y.o. 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  #1 generalized weakness-dehydration and also from recent chemotherapy and worsening of her labs. -IV fluids, physical therapy consult - pain management - care management consult requested as well.  #2 neutropenia-leukopenia. We'll get a differential count. -If ANC is less than 500, oncology to start oral antibiotics prophylaxis. -Cultures have been ordered but no antibiotics at this time until [place] is back. No fevers or chills. -Continue neutropenic precautions. -GSF stimulating factors will be ordered if ANC less than 500   #3 anemia-likely from recent chemotherapy., Acute on chronic anemia. -Hemoglobin at 7.9. Continue to monitor. -Transfuse if hemoglobin drops less than 7.  #4 chronic back pain-we'll need significant pain control. Continue fentanyl patch at 50 mcgs, Dilaudid IV for severe pain and Norco for moderate pain -If not improving, palliative  care consult for pain management  #5 hyponatremia-IV fluids, likely due to dehydration.   #6 worsening LFTs-likely due to liver metastases. Also jaundiced. -Plan per oncology.  #7 metastatic breast cancer-metastases to liver, lung, bone. Refractory to prior chemotherapy and started on palliative chemotherapy at this time. -Appears to the poor prognosis. -Oncology consulted. Dr. Ma Hillock is notified.  #8 DVT  prophylaxis-due to her metastatic cancer, high risk of DVTs. We'll start on subcutaneous heparin as it is short-acting. Monitor her hemoglobin and if it drops then will need to stop.Marland Kitchen Also Ted's and SCDs  Patient will be admitted to oncology floor. Physical Therapy consulted. No living will so far. Discussed a little- full code for now. Dr. Ma Hillock will address further.    All the records are reviewed and case discussed with ED provider. Management plans discussed with the patient, family and they are in agreement.  CODE STATUS: Full code  TOTAL TIME TAKING CARE OF THIS PATIENT: 60 minutes.    Jenna Pitts M.D on 10/17/2014 at 2:30 PM  Between 7am to 6pm - Pager - [phone]  After 6pm go to [email or web address] - password [email or web address] [facility]  [facility]  Office  [phone]  CC: Primary care physician; Ria Bush, MD

## 2014-10-17 NOTE — Telephone Encounter (Signed)
Pt states that she had bad night last night.  Taking morphine about every 45 min to 1 hr.  Feels very weak and unsteady on her feet when she tries to get up and move  Around.  Every time she eats something or drinks anything she gets nauseated.  She had a small BM in middle of the night with the use of lactulose.  Her husband and pt feels that she is much more jaundice than she has been in her eyes as well as her skin.  Spoke to pandit and he suggested to go to ER with the infusion area not able to add pt on and schedule here is busy.  I have called ER to let the charge nurse know that pt is coming and the sx she is having.  Called pt and told her that the ER would be best suited for her pain /nausea issues and be able to get to her quickly.  When I called the ER that had a 10  Min. Wait.  Pt's husband will take her immediately.  Also told pt as well as charge nurse that they can have md in ER to call if they need any info or help.

## 2014-10-17 NOTE — Plan of Care (Signed)
Problem: Discharge Progression Outcomes Goal: Discharge plan in place and appropriate Individualization of care Pt likes to be called Jenna Pitts PMH: GERD Anemia Breast Ca Mets to Liver, lung and bone

## 2014-10-17 NOTE — ED Notes (Signed)
Removed expired Fentanyl patch; await admit bed assignment

## 2014-10-17 NOTE — Consult Note (Signed)
ONCOLOGY CONSULTATION NOTE -   Reason for Consultation: Metastatic breast cancer, admitted with weakness  History of Present Illness - HISTORY OF PRESENT ILLNESS:  Jenna Pitts is a 64 y.o. 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 Patient has been admitted to hospital today since she has had declining condition. She has progressive generalized weakness, decreased oral intake. She has developed progressive jaundice. Currently denies nausea or vomiting. She has had severe pain issues in the right upper quadrant abdomen but states that it is currently better controlled after getting IV Dilaudid and is on fentanyl patch. Denies fevers, new cough or sputum, diarrhea, dysuria or hematuria. Denies any loss of consciousness or seizures. Today she is noted to have a sodium of 122, elevated LFTs and decreased WBC to 1K. Hemoglobin has dropped down to 7.9.   PAST MEDICAL HISTORY:   Past Medical History  Diagnosis Date  . Blood transfusion without reported diagnosis [place]  . GERD (gastroesophageal reflux disease)   . Staph infection 1986    AFTER C/S 39 YEARS AGO  . Headache     used to have migraines but no longer  . Breast cancer     metastasized to liver, lung and bone  . Anemia     blood transfusion 40 years ago    PAST SURGICAL HISTORY:   Past Surgical History  Procedure Laterality Date  . Breast lumpectomy with axillary lymph node biopsy    . Portacath placement  04/13/14  . Cesarean section  08/06/75, 12/11/78    X2  . Breast surgery Right 03/06/14    partial mastectomy  . Mastectomy modified radical Right 09/15/2014    right, Procedure: MASTECTOMY MODIFIED  RADICAL; Surgeon: Robert Bellow, MD; Location: [facility] ORS; Service: General; Laterality: Right;    SOCIAL HISTORY:   Social History  Substance Use Topics  . Smoking status: Never Smoker   . Smokeless tobacco: Never Used  . Alcohol Use: No    FAMILY HISTORY:   Family History  Problem Relation Age of Onset  . Cancer Mother     COLON  . Cancer Maternal Grandfather     COLON  . Melanoma Sister     DRUG ALLERGIES:   Allergies  Allergen Reactions  . Aspirin Other (See Comments)    Reaction: Ringing ears   . Perjeta [Pertuzumab] Other (See Comments) and Hypertension    Pt developed chills and hypertension towards end of tx, Dr Grayland Ormond stopped tx and medicated with Benadyrl and Solucortef.   . Band-Aid Plus Antibiotic [Bacitracin-Polymyxin B] Rash and Other (See Comments)    Pt states this only happens when applied to her chest area.   . Pseudoephedrine Palpitations    REVIEW OF SYSTEMS:   Review of Systems  Constitutional: Positive for malaise/fatigue. No fever, chills  HEENT: Denies headaches, epistaxis, ear pain,  Respiratory: Negative for cough, hemoptysis, shortness of breath and wheezing.  Cardiovascular: Positive for leg swelling. Negative for chest pain, palpitations and orthopnea.  Gastrointestinal: Positive for abdominal pain and constipation. Negative for heartburn, nausea, vomiting, diarrhea and melena.  Genitourinary: Negative for dysuria, urgency, frequency and hematuria.  Musculoskeletal: Positive for myalgias and back pain. Negative for neck pain.  Neurological: Denies focal weakness, seizures or loss of consciousness  Endo/Heme/Allergies: Does not bruise/bleed easily.  Psychiatric/Behavioral: Negative  for depression.    MEDICATIONS AT HOME:   Prior to Admission medications   Medication Sig Start Date End Date Taking? Authorizing Provider  fentaNYL  (DURAGESIC - DOSED MCG/HR) 12 MCG/HR Place 1 patch (12.5 mcg total) onto the skin every 3 (three) days. 10/09/14  Yes Leia Alf, MD  HYDROcodone-acetaminophen (NORCO/VICODIN) 5-325 MG per tablet Take 1-2 tablets by mouth every 4 (four) hours as needed for moderate pain.    Yes Historical Provider, MD  morphine (ROXANOL) 20 MG/ML concentrated solution Take 10 - 20 mg (0.5 - 1 mL) orally once every 1 - 2 hours as needed for pain. Patient taking differently: Take 10-20 mg by mouth every hour as needed for severe pain.  10/13/14  Yes Leia Alf, MD  ondansetron (ZOFRAN ODT) 4 MG disintegrating tablet Take 1 tablet (4 mg total) by mouth every 4 (four) hours as needed for nausea or vomiting. 10/10/14  Yes Leia Alf, MD  Lactulose 20 GM/30ML SOLN Take 30 mLs (20 g total) by mouth 3 (three) times daily as needed. Patient not taking: Reported on 10/17/2014 10/16/14   Leia Alf, MD    PHYSICAL EXAM -   VITAL SIGNS:  Blood pressure 137/71, pulse 121, temperature 98.3 F (36.8 C), temperature source Oral, resp. rate 23, height 5\' 6"  (1.676 m), weight 83.717 kg (184 lb 9 oz), SpO2 91 %.  PHYSICAL EXAMINATION:  GENERAL: weak- looking, sitting in bed, otherwise alert and oriented and in no acute distress. Icterus present. HEENT - EOMs intact. No oral thrush, mouth is dry.   LUNGS: Bilaterally good air entry, no rhonchi or crepitations.   CVS: S1, S2 regular.  ABDOMEN: Soft, distended. Tenderness in RUQ, no rigidity or rebound tenderness. Bowel sounds present. EXTREMITIES: 2+ pedal edema, No cyanosis.  NEUROLOGIC: Cranial nerves intact. Grossly nonfocal.  PSYCHIATRIC: mood is calm.  SKIN: No obvious rash  LABORATORY PANEL:   CBC  Last Labs      Recent Labs Lab 10/17/14 1136  WBC 1.0*  HGB 7.9*  HCT 23.8*  PLT 163      ------------------------------------------------------------------------------------------------------------------  Chemistries   Last Labs      Recent Labs Lab 10/17/14 1136  NA 122*  K 4.5  CL 81*  CO2 24  GLUCOSE 119*  BUN 12  CREATININE 0.46  CALCIUM 7.1*  AST 241*  ALT 105*  ALKPHOS 1117*  BILITOT 6.7*       IMPRESSION/RECOMMENDATIONS: 65 year old female patient with known history of widely metastatic right breast cancer, extensive liver metastasis, lung and bone involvement who recently started on palliative chemotherapy with Herceptin/Perjeta/Navelbine. Patient is admitted with progressive weakness, decreased oral intake and failure to thrive. She also has progressive jaundice and liver functions are showing worsening serum bilirubin and liver functions likely indicative of progressive hepatic insufficiency from widespread metastatic cancer. Agree with ongoing supportive treatment with IV fluids, aggressive pain management (currently on fentanyl patch along with Norco when necessary for moderate pain and IV Dilaudid when necessary for severe pain), treat hyponatremia. If pain is still poorly controlled, continue to titrate medication and consider switching to PCA infusion. Patient has leukopenia and anemia secondary to recent chemotherapy, no fever at this time. Will repeat CBC and differential tomorrow, if she develops severe neutropenia could consider G-CSF support as indicated. Consider PRBC tx as indicated. Overall prognosis is extremely poor, patient and husband present are aware of this. Also discussed CODE STATUS in detail with the patient and her husband present at bedside, she does not want  mechanical ventilation or resuscitation in case of cardiopulmonary failure/dentist, will order DO NOT RESUSCITATE status accordingly. Will continue to follow as indicated. Thank you for the referral, please feel free to contact me if any additional questions  .

## 2014-10-18 ENCOUNTER — Ambulatory Visit: Payer: BLUE CROSS/BLUE SHIELD | Admitting: Radiation Oncology

## 2014-10-18 LAB — COMPREHENSIVE METABOLIC PANEL
ALK PHOS: 1051 U/L — AB (ref 38–126)
ALT: 98 U/L — ABNORMAL HIGH (ref 14–54)
ANION GAP: 17 — AB (ref 5–15)
AST: 243 U/L — ABNORMAL HIGH (ref 15–41)
Albumin: 1.8 g/dL — ABNORMAL LOW (ref 3.5–5.0)
BILIRUBIN TOTAL: 7.5 mg/dL — AB (ref 0.3–1.2)
BUN: 13 mg/dL (ref 6–20)
CALCIUM: 6.9 mg/dL — AB (ref 8.9–10.3)
CO2: 22 mmol/L (ref 22–32)
Chloride: 87 mmol/L — ABNORMAL LOW (ref 101–111)
Creatinine, Ser: 0.31 mg/dL — ABNORMAL LOW (ref 0.44–1.00)
GFR calc non Af Amer: >60 mL/min (ref >60)
Glucose, Bld: 91 mg/dL (ref 65–99)
Potassium: 4.3 mmol/L (ref 3.5–5.1)
Sodium: 126 mmol/L — ABNORMAL LOW (ref 135–145)
TOTAL PROTEIN: 4.9 g/dL — AB (ref 6.5–8.1)

## 2014-10-18 LAB — CBC WITH DIFFERENTIAL/PLATELET
BASOS ABS: 0 10*3/uL (ref 0–0.1)
Eosinophils Absolute: 0 10*3/uL (ref 0–0.7)
Eosinophils Relative: 1 %
HEMATOCRIT: 22.4 % — AB (ref 35.0–47.0)
HEMOGLOBIN: 7.5 g/dL — AB (ref 12.0–16.0)
Lymphocytes Relative: 15 %
Lymphs Abs: 0.2 10*3/uL — ABNORMAL LOW (ref 1.0–3.6)
MCH: 31.7 pg (ref 26.0–34.0)
MCHC: 33.3 g/dL (ref 32.0–36.0)
MCV: 95.3 fL (ref 80.0–100.0)
MONO ABS: 0.5 10*3/uL (ref 0.2–0.9)
Monocytes Relative: 34 %
NEUTROS ABS: 0.8 10*3/uL — AB (ref 1.4–6.5)
Platelets: 184 10*3/uL (ref 150–440)
RBC: 2.35 MIL/uL — ABNORMAL LOW (ref 3.80–5.20)
RDW: 19.4 % — AB (ref 11.5–14.5)
WBC: 1.5 10*3/uL — ABNORMAL LOW (ref 3.6–11.0)

## 2014-10-18 MED ORDER — FAMOTIDINE 20 MG PO TABS
20.0000 mg | ORAL_TABLET | Freq: Two times a day (BID) | ORAL | Status: DC | PRN
  Administered 2014-10-18 – 2014-10-21 (×4): 20 mg via ORAL
  Filled 2014-10-18 (×4): qty 1

## 2014-10-18 MED ORDER — METOCLOPRAMIDE HCL 5 MG PO TABS
10.0000 mg | ORAL_TABLET | Freq: Three times a day (TID) | ORAL | Status: DC
  Administered 2014-10-18 – 2014-10-20 (×7): 10 mg via ORAL
  Filled 2014-10-18 (×7): qty 2

## 2014-10-18 MED ORDER — LACTULOSE 10 GM/15ML PO SOLN
20.0000 g | Freq: Every day | ORAL | Status: DC
  Administered 2014-10-18: 12:00:00 20 g via ORAL
  Filled 2014-10-18: qty 30

## 2014-10-18 MED ORDER — SODIUM CHLORIDE 0.9 % IV BOLUS (SEPSIS)
500.0000 mL | Freq: Once | INTRAVENOUS | Status: AC
  Administered 2014-10-18: 500 mL via INTRAVENOUS

## 2014-10-18 MED ORDER — SALINE SPRAY 0.65 % NA SOLN
1.0000 | NASAL | Status: DC | PRN
  Administered 2014-10-19 (×2): 1 via NASAL
  Filled 2014-10-18: qty 44

## 2014-10-18 NOTE — Clinical Social Work Note (Signed)
CSW acknowledges consult.  PT eval is needed for insurance auth.  CSW will f/u with full assessment later today

## 2014-10-18 NOTE — Care Management (Signed)
Patient admitted with generalized weakness-dehydration and also from recent chemotherapy and worsening of her labs.  Patient lives at home with her husband.  Patient obtains her medication at [facility] on [address], and has no trouble obtaining her medication.  Patient states that she has a shower chair at home.  Patient's husband transports her to her doctors appointments.  Will continue to follow for discharge planning.

## 2014-10-18 NOTE — Plan of Care (Signed)
Problem: Discharge Progression Outcomes Goal: Other Discharge Outcomes/Goals Outcome: Progressing Pt is alert and oriented, c/o pain in right side of back and abdomen, Dilaudid and Norco given with relief. Pt c/o nausea, Zofran given x2 with some relief. NS at 100 ml/hr. Husband at bedside.

## 2014-10-18 NOTE — Evaluation (Signed)
Physical Therapy Evaluation Patient Details Name: Jenna Pitts MRN: [id number] DOB: 1950/03/08 Today's Date: 10/18/2014   History of Present Illness  Jenna Pitts is a 64 y.o. 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 Her breast cancer was first diagnosed in December 2015 following which she had lumpectomy, chemotherapy and radiation. She had progress to worsening of the disease on the same side and repeat PET scan showing disease spread to liver and lungs and bone in July 2016. Patient had radical mastectomy on the right side. She is following with Dr. Ma Hillock and it then was started on chemotherapy with Navelbine/Herceptin/perjeta 2 weeks ago. No Neupogen or Neulasta received following chemotherapy. She had her second dose of navelbine 5 days ago. Her labs from 3 days ago showing significant worsening of her LFTs and also bilirubin. Patient has been having on and off nausea since then. She's been dehydrated requiring IV fluid administration in the office twice in the last week. She is unable to even walk around in the house due to worsening back pain. She was started on fentanyl patch, asked to increase the dose and also started on Roxanol as outpatient in spite of which she is having worsening back pain. She was admitted for progressive weakness and decreased oral intake. In addition pt with neutropenia/leukopenia, anemia, and hyponatremia. Prior to 2 weeks ago pt reports ambulating independently at home without an assistive device. She was independent with ADLs/IADLs. She reports one recent fall at home where her legs collapsed underneath her and she fell.   Clinical Impression  Pt demonstrates generalized weakness during  session. However she demonstrates safety and stability with transfers and ambulation rolling walker. She wishes to return home at discharge and will be safe to return with use of rolling walker for ambulation, HHPT, and supervision from husband. Recommend use of BSC for toileting at night due to safety concerns with pain meds and low light conditions. Patient is in agreement. Pt will benefit from skilled PT services to address deficits in strength, balance, and mobility in order to return to full function at home.     Follow Up Recommendations Home health PT;Supervision for mobility/OOB    Equipment Recommendations  Rolling walker with 5" wheels;3in1 (PT) (If needed purchase tub bench)    Recommendations for Other Services       Precautions / Restrictions Precautions Precautions: Fall Restrictions Weight Bearing Restrictions: No      Mobility  Bed Mobility Overal bed mobility: Independent             General bed mobility comments: Good sequencing and strength although slight increase in time required to perform  Transfers Overall transfer level: Needs assistance Equipment used: Rolling walker (2 wheeled) Transfers: Sit to/from Stand Sit to Stand: Min guard;+2 safety/equipment         General transfer comment: Pt demonstrates reasonable speed and sequencing with transfer. +2 present for safety due to history of LE buckling. Pt requires cues for safe hand placement but continues to attempt to pull up on walker and does not put hands back on surface when sitting  Ambulation/Gait Ambulation/Gait assistance: Min guard;+2 physical assistance (chair follow for safety due to LE buckling) Ambulation Distance (Feet): 100 Feet Assistive device: Rolling walker (2 wheeled) Gait Pattern/deviations: Decreased step  length - right;Decreased step length - left   Gait velocity interpretation: <1.8 ft/sec, indicative of risk for recurrent falls General Gait Details: Pt demonstrates overall  decreased gait speed and step length. Increased UE reliance due to LE weakness. Chair follow for safety. Fatigue and pain monitored during ambualtion. Continual assessment of balance performed during gait to ensure safety  Stairs            Wheelchair Mobility    Modified Rankin (Stroke Patients Only)       Balance Overall balance assessment: Needs assistance   Sitting balance-Leahy Scale: Good       Standing balance-Leahy Scale: Fair                               Pertinent Vitals/Pain Pain Assessment: No/denies pain (Recently given pain meds)    Home Living Family/patient expects to be discharged to:: Private residence Living Arrangements: Spouse/significant other Available Help at Discharge: Family Type of Home: House Home Access: Stairs to enter Entrance Stairs-Rails: None Technical brewer of Steps: 1 three inch thresshold Home Layout: One level Home Equipment: Shower seat      Prior Function Level of Independence: Independent (2 weeks ago, progressive decline)               Hand Dominance   Dominant Hand: Right    Extremity/Trunk Assessment   Upper Extremity Assessment: Generalized weakness (Grossly 4/5 throughout)           Lower Extremity Assessment: Generalized weakness (4 to 4+/5 throughout)         Communication   Communication: No difficulties  Cognition Arousal/Alertness: Awake/alert Behavior During Therapy: WFL for tasks assessed/performed Overall Cognitive Status: Within Functional Limits for tasks assessed                      General Comments      Exercises        Assessment/Plan    PT Assessment Patient needs continued PT services  PT Diagnosis Difficulty walking;Generalized weakness   PT Problem List Decreased strength;Decreased activity tolerance;Decreased balance;Decreased knowledge of use of DME;Decreased safety awareness;Pain  PT Treatment Interventions DME instruction;Gait  training;Stair training;Therapeutic activities;Therapeutic exercise;Balance training;Neuromuscular re-education   PT Goals (Current goals can be found in the Care Plan section) Acute Rehab PT Goals Patient Stated Goal: "I want to go home" PT Goal Formulation: With patient/family Time For Goal Achievement: 11/01/14 Potential to Achieve Goals: Fair    Frequency Min 2X/week   Barriers to discharge        Co-evaluation               End of Session Equipment Utilized During Treatment: Gait belt Activity Tolerance: Patient tolerated treatment well;Patient limited by fatigue Patient left: in chair;with call bell/phone within reach;with chair alarm set;with family/visitor present           Time: 2751-7001 PT Time Calculation (min) (ACUTE ONLY): 32 min   Charges:   PT Evaluation $Initial PT Evaluation Tier I: 1 Procedure PT Treatments $Therapeutic Exercise: 8-22 mins   PT G Codes:       Lyndel Safe Lauriana Denes PT, DPT   Marieta Markov 10/18/2014, 9:54 AM

## 2014-10-18 NOTE — Progress Notes (Signed)
[place] at [place] NAME: Akshita Italiano    MR#:  [id number]  DATE OF BIRTH:  [birth year]  SUBJECTIVE:  CHIEF COMPLAINT:   Chief Complaint  Patient presents with  . Weakness   Still has pain and vomiting REVIEW OF SYSTEMS:    Review of Systems  Constitutional: Positive for malaise/fatigue. Negative for fever and chills.  HENT: Negative for sore throat.   Eyes: Negative for blurred vision, double vision and pain.  Respiratory: Negative for cough, hemoptysis, shortness of breath and wheezing.   Cardiovascular: Negative for chest pain, palpitations, orthopnea and leg swelling.  Gastrointestinal: Positive for heartburn, nausea, vomiting, abdominal pain and constipation. Negative for diarrhea.  Genitourinary: Negative for dysuria and hematuria.  Musculoskeletal: Positive for back pain. Negative for joint pain.  Skin: Negative for rash.  Neurological: Positive for dizziness and weakness. Negative for sensory change, speech change, focal weakness and headaches.  Endo/Heme/Allergies: Does not bruise/bleed easily.  Psychiatric/Behavioral: Negative for depression. The patient has insomnia. The patient is not nervous/anxious.       DRUG ALLERGIES:   Allergies  Allergen Reactions  . Aspirin Other (See Comments)    Reaction:  Ringing ears   . Perjeta [Pertuzumab] Other (See Comments) and Hypertension    Pt developed chills and hypertension towards end of tx, Dr Grayland Ormond stopped tx and medicated with Benadyrl and Solucortef.    . Band-Aid Plus Antibiotic [Bacitracin-Polymyxin B] Rash and Other (See Comments)    Pt states this only happens when applied to her chest area.    . Pseudoephedrine Palpitations    VITALS:  Blood pressure 139/77, pulse 118, temperature 98.4 F (36.9 C), temperature source Oral, resp. rate 21, height 5\' 6"  (1.676 m), weight 81.738 kg (180 lb 3.2 oz), SpO2 98 %.  PHYSICAL EXAMINATION:   Physical  Exam  GENERAL:  64 y.o.-year-old patient lying in the bed with no acute distress. Jaundice EYES: Pupils equal, round, reactive to light and accommodation. . Extraocular muscles intact.  HEENT: Head atraumatic, normocephalic. Oropharynx and nasopharynx clear.  NECK:  Supple, no jugular venous distention. No thyroid enlargement, no tenderness.  LUNGS: Normal breath sounds bilaterally, no wheezing, rales, rhonchi. No use of accessory muscles of respiration.  CARDIOVASCULAR: S1, S2 normal. No murmurs, rubs, or gallops.  ABDOMEN: Soft. Bowel sounds present. No organomegaly or mass. Tender. Hepatomegaly EXTREMITIES: No cyanosis, clubbing or edema b/l.    NEUROLOGIC: Cranial nerves II through XII are intact. No focal Motor or sensory deficits b/l.   PSYCHIATRIC: The patient is alert and oriented x 3.  SKIN: No obvious rash, lesion, or ulcer.    LABORATORY PANEL:   CBC  Recent Labs Lab 10/18/14 0533  WBC 1.5*  HGB 7.5*  HCT 22.4*  PLT 184   ------------------------------------------------------------------------------------------------------------------  Chemistries   Recent Labs Lab 10/18/14 0533  NA 126*  K 4.3  CL 87*  CO2 22  GLUCOSE 91  BUN 13  CREATININE 0.31*  CALCIUM 6.9*  AST 243*  ALT 98*  ALKPHOS 1051*  BILITOT 7.5*   ------------------------------------------------------------------------------------------------------------------  Cardiac Enzymes No results for input(s): TROPONINI in the last 168 hours. ------------------------------------------------------------------------------------------------------------------  RADIOLOGY:  No results found.   ASSESSMENT AND PLAN:   Sidonia Nutter is a 64 y.o. 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  # Generalized weakness-dehydration and also from recent chemotherapy and worsening of her labs. IVF Has hyponatremia - Improving  # Pancytopenia Due to malignancy and chemotherapy. As per oncology.  # chronic back pain-we'll need significant pain control. Continue fentanyl patch Does not want dilaudid. Will stop. Patch and PO meds  # worsening LFTs-likely due to liver metastases. Also jaundiced. -Plan per oncology.  # metastatic breast cancer-metastases to liver, lung, bone. Refractory to prior chemotherapy and started on palliative chemotherapy at this time. Poor prognosis Needs hospice. Follows with Dr. Ma Hillock  # DVT prophylaxis heparin  Patient understands poor prognosis.  All the records are reviewed and case discussed with Care Management/Social Workerr. Management plans discussed with the patient, family and they are in agreement.  CODE STATUS: DNR  TOTAL TIME TAKING CARE OF THIS PATIENT: 40 minutes.   POSSIBLE D/C IN 1-2 DAYS once nausea and pain improve   [name] R M.D on 10/18/2014 at 11:30 AM  Between 7am to 6pm - Pager - [phone]  After 6pm go to [email or web address] - password [email or web address] [facility]  [facility]  Office  [phone]  CC: Primary care physician; Ria Bush, MD

## 2014-10-19 ENCOUNTER — Inpatient Hospital Stay: Payer: BLUE CROSS/BLUE SHIELD

## 2014-10-19 LAB — BASIC METABOLIC PANEL
Anion gap: 16 — ABNORMAL HIGH (ref 5–15)
BUN: 14 mg/dL (ref 6–20)
CHLORIDE: 90 mmol/L — AB (ref 101–111)
CO2: 20 mmol/L — ABNORMAL LOW (ref 22–32)
Calcium: 6.8 mg/dL — ABNORMAL LOW (ref 8.9–10.3)
Creatinine, Ser: 0.38 mg/dL — ABNORMAL LOW (ref 0.44–1.00)
GFR calc Af Amer: >60 mL/min (ref >60)
GFR calc non Af Amer: >60 mL/min (ref >60)
GLUCOSE: 77 mg/dL (ref 65–99)
POTASSIUM: 4.4 mmol/L (ref 3.5–5.1)
SODIUM: 126 mmol/L — AB (ref 135–145)

## 2014-10-19 LAB — CBC
HCT: 22.4 % — ABNORMAL LOW (ref 35.0–47.0)
HEMOGLOBIN: 7.4 g/dL — AB (ref 12.0–16.0)
MCH: 31.7 pg (ref 26.0–34.0)
MCHC: 32.9 g/dL (ref 32.0–36.0)
MCV: 96.4 fL (ref 80.0–100.0)
Platelets: 212 10*3/uL (ref 150–440)
RBC: 2.33 MIL/uL — AB (ref 3.80–5.20)
RDW: 19.8 % — ABNORMAL HIGH (ref 11.5–14.5)
WBC: 2.1 10*3/uL — ABNORMAL LOW (ref 3.6–11.0)

## 2014-10-19 MED ORDER — FUROSEMIDE 10 MG/ML IJ SOLN
40.0000 mg | Freq: Once | INTRAMUSCULAR | Status: AC
  Administered 2014-10-19: 11:00:00 40 mg via INTRAVENOUS
  Filled 2014-10-19: qty 4

## 2014-10-19 MED ORDER — METOPROLOL TARTRATE 25 MG PO TABS
12.5000 mg | ORAL_TABLET | Freq: Two times a day (BID) | ORAL | Status: DC
  Administered 2014-10-19 – 2014-10-21 (×5): 12.5 mg via ORAL
  Filled 2014-10-19 (×5): qty 1

## 2014-10-19 MED ORDER — FUROSEMIDE 10 MG/ML IJ SOLN
40.0000 mg | Freq: Two times a day (BID) | INTRAMUSCULAR | Status: DC
  Administered 2014-10-19 – 2014-10-21 (×4): 40 mg via INTRAVENOUS
  Filled 2014-10-19 (×4): qty 4

## 2014-10-19 NOTE — Progress Notes (Signed)
[place] at [place] NAME: Jenna Pitts    MR#:  [id number]  DATE OF BIRTH:  Jul 27, 1950  SUBJECTIVE:  CHIEF COMPLAINT:   Chief Complaint  Patient presents with  . Weakness   Still has pain and vomiting but improved. Today she has SOB and worsening LE swelling. Doesn't feel good. Family at bedside.  REVIEW OF SYSTEMS:    Review of Systems  Constitutional: Positive for malaise/fatigue. Negative for fever and chills.  HENT: Negative for sore throat.   Eyes: Negative for blurred vision, double vision and pain.  Respiratory: Negative for cough, hemoptysis, shortness of breath and wheezing.   Cardiovascular: Negative for chest pain, palpitations, orthopnea and leg swelling.  Gastrointestinal: Positive for heartburn, nausea, vomiting, abdominal pain and constipation. Negative for diarrhea.  Genitourinary: Negative for dysuria and hematuria.  Musculoskeletal: Positive for back pain. Negative for joint pain.  Skin: Negative for rash.  Neurological: Positive for dizziness and weakness. Negative for sensory change, speech change, focal weakness and headaches.  Endo/Heme/Allergies: Does not bruise/bleed easily.  Psychiatric/Behavioral: Negative for depression. The patient has insomnia. The patient is not nervous/anxious.       DRUG ALLERGIES:   Allergies  Allergen Reactions  . Aspirin Other (See Comments)    Reaction:  Ringing ears   . Perjeta [Pertuzumab] Other (See Comments) and Hypertension    Pt developed chills and hypertension towards end of tx, Dr Grayland Ormond stopped tx and medicated with Benadyrl and Solucortef.    . Band-Aid Plus Antibiotic [Bacitracin-Polymyxin B] Rash and Other (See Comments)    Pt states this only happens when applied to her chest area.    . Pseudoephedrine Palpitations    VITALS:  Blood pressure 122/72, pulse 113, temperature 97.6 F (36.4 C), temperature source Oral, resp. rate 18, height 5\' 6"  (1.676  m), weight 81.738 kg (180 lb 3.2 oz), SpO2 96 %.  PHYSICAL EXAMINATION:   Physical Exam  GENERAL:  64 y.o.-year-old patient lying in the bed with no acute distress. Jaundice EYES: Pupils equal, round, reactive to light and accommodation. . Extraocular muscles intact.  HEENT: Head atraumatic, normocephalic. Oropharynx and nasopharynx clear.  NECK:  Supple, no jugular venous distention. No thyroid enlargement, no tenderness.  LUNGS: Basal crackles with decreases BS right. CARDIOVASCULAR: S1, S2 normal. No murmurs, rubs, or gallops.  ABDOMEN: Soft. Bowel sounds present. No organomegaly or mass. Tender. Hepatomegaly EXTREMITIES: No cyanosis, clubbing or edema b/l.    NEUROLOGIC: Cranial nerves II through XII are intact. No focal Motor or sensory deficits b/l.   PSYCHIATRIC: The patient is alert and oriented x 3.  SKIN: No obvious rash, lesion, or ulcer.    LABORATORY PANEL:   CBC  Recent Labs Lab 10/19/14 0537  WBC 2.1*  HGB 7.4*  HCT 22.4*  PLT 212   ------------------------------------------------------------------------------------------------------------------  Chemistries   Recent Labs Lab 10/18/14 0533 10/19/14 0537  NA 126* 126*  K 4.3 4.4  CL 87* 90*  CO2 22 20*  GLUCOSE 91 77  BUN 13 14  CREATININE 0.31* 0.38*  CALCIUM 6.9* 6.8*  AST 243*  --   ALT 98*  --   ALKPHOS 1051*  --   BILITOT 7.5*  --    ------------------------------------------------------------------------------------------------------------------  Cardiac Enzymes No results for input(s): TROPONINI in the last 168 hours. ------------------------------------------------------------------------------------------------------------------  RADIOLOGY:  Dg Chest 2 View  10/19/2014   CLINICAL DATA:  Shortness of breath.  EXAM: CHEST  2 VIEW  COMPARISON:  None.  FINDINGS: The heart size and mediastinal contours are within normal limits. Left internal jugular Port-A-Cath is noted with distal tip in  expected position of SVC. No pneumothorax is noted. Bilateral pleural effusions are noted with right greater than left underlying atelectasis or infiltrate in right lung base cannot be excluded. Mild central pulmonary vascular congestion is noted with possible mild bilateral perihilar edema. The visualized skeletal structures are unremarkable.  IMPRESSION: Mild central pulmonary vascular congestion with possible mild perihilar edema. Bilateral pleural effusions are noted with right greater than left.   Electronically Signed   By: Marijo Conception, M.D.   On: 10/19/2014 10:38     ASSESSMENT AND PLAN:   Jenna Pitts is a 64 y.o. 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  * Pulmonary edema Due to hypoalbuminemia and fluid overload. Recent MUGA had normal EF. Start STAT IV lasix BID.  # Generalized weakness-dehydration and also from recent chemotherapy and worsening of her labs. Has hyponatremia - Improving  # Pancytopenia Due to malignancy and chemotherapy. As per oncology.  # chronic back pain-we'll need significant pain control. Continue fentanyl patch Does not want dilaudid. Will stop. Patch and PO meds  # worsening LFTs-likely due to liver metastases. Also jaundiced.  # metastatic breast cancer-metastases to liver, lung, bone. Refractory to prior chemotherapy and started on palliative chemotherapy at this time. Poor prognosis Needs hospice. Follows with Dr. Ma Hillock  # DVT prophylaxis heparin  Patient understands poor prognosis.  Discussed with Dr. Ma Hillock. He will see the patient tomorrow. Will likely be discharged home with hospice or hospice home if worse.  All the records are reviewed and case  discussed with Care Management/Social Workerr. Management plans discussed with the patient, family and they are in agreement.  CODE STATUS: DNR  TOTAL TIME TAKING CARE OF THIS PATIENT: 40 minutes.   POSSIBLE D/C TOMORROW. Home with hospice   [name] R M.D on 10/19/2014 at 12:47 PM  Between 7am to 6pm - Pager - [phone]  After 6pm go to [email or web address] - password [email or web address] [facility]  [place] [facility]  Office  [phone]  CC: Primary care physician; Ria Bush, MD

## 2014-10-19 NOTE — Plan of Care (Signed)
Problem: Discharge Progression Outcomes Goal: Other Discharge Outcomes/Goals Outcome: Progressing Patient had several c/o pain today relieved by prn Norco Also given saline nasal spray x1 for dry nose with relief Family at bedside  VSS Patient started on Lasix this shift, continuous IV fluids discontinued

## 2014-10-19 NOTE — Progress Notes (Signed)
Physical Therapy Treatment Patient Details Name: Jenna Pitts MRN: [id number] DOB: 1950/12/23 Today's Date: 10/19/2014    History of Present Illness Jenna Pitts is a 64 y.o. 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 Her breast cancer was first diagnosed in December 2015 following which she had lumpectomy, chemotherapy and radiation. She had progress to worsening of the disease on the same side and repeat PET scan showing disease spread to liver and lungs and bone in July 2016. Patient had radical mastectomy on the right side. She is following with Dr. Ma Hillock and it then was started on chemotherapy with Navelbine/Herceptin/perjeta 2 weeks ago. No Neupogen or Neulasta received following chemotherapy. She had her second dose of navelbine 5 days ago. Her labs from 3 days ago showing significant worsening of her LFTs and also bilirubin. Patient has been having on and off nausea since then. She's been dehydrated requiring IV fluid administration in the office twice in the last week. She is unable to even walk around in the house due to worsening back pain. She was started on fentanyl patch, asked to increase the dose and also started on Roxanol as outpatient in spite of which she is having worsening back pain. She was admitted for progressive weakness and decreased oral intake. In addition pt with neutropenia/leukopenia, anemia, and hyponatremia. Prior to 2 weeks ago pt reports ambulating independently at home without an assistive device. She was independent with ADLs/IADLs. She reports one recent fall at home where her legs collapsed underneath her and she fell.     PT Comments    PT cleared mobility with RN who had just been in  the room to discuss mobility with patient. Patient is visibly ill, however she agrees to treatment. Her HR has been elevated throughout the day, though it increased only slightly throughout this session and she did not become symptomatic. Chair follow was utilized as she fatigued throughout ambulation, however she increased her total distance today and demonstrates no loss of balance. Patient will need supervision for all OOB mobility at home as she displays significant LE weakness. Family present throughout session and very supportive.   Follow Up Recommendations  Home health PT;Supervision for mobility/OOB     Equipment Recommendations  Rolling walker with 5" wheels;3in1 (PT)    Recommendations for Other Services       Precautions / Restrictions Precautions Precautions: Fall Restrictions Weight Bearing Restrictions: No    Mobility  Bed Mobility Overal bed mobility: Independent             General bed mobility comments: Patient required no physical assistance for transfers in and out of bed, slightly prolonged time to perform sit to supine transfer secondary to LE weakness.   Transfers Overall transfer level: Needs assistance Equipment used: Rolling walker (2 wheeled) Transfers: Sit to/from Stand Sit to Stand: Supervision         General transfer comment: Patient does not lose balance with transfer, though she needs min A x1 to transfer sit to stand from chair in the hallway. Patient able to transfer from bed to standing with no physical assistance, however required cuing for safe hand placement as she attempts to pull walker rather than push down into it.   Ambulation/Gait Ambulation/Gait assistance: Physicist, medical (  Feet): 115 Feet Assistive device: Rolling walker (2 wheeled) Gait Pattern/deviations: Decreased step length - right;Decreased step length - left;Trunk flexed   Gait velocity interpretation: Below normal speed for age/gender General Gait  Details: Patient demonstrates decreased stride length bilaterally with reciprocal gait pattern. Chair follow for safety as she has a history of having her knees buckle. Patient fatigued midway through ambulation and required a sitting break. Her HR increased to low 130s during ambulation, however she was not symptomatic.    Stairs            Wheelchair Mobility    Modified Rankin (Stroke Patients Only)       Balance Overall balance assessment: Needs assistance Sitting-balance support: Feet unsupported Sitting balance-Leahy Scale: Good Sitting balance - Comments: No balance deficits noted in sitting.    Standing balance support: Bilateral upper extremity supported Standing balance-Leahy Scale: Fair Standing balance comment: Patient ambulated with RW and no loss of balance or buckling of her LEs. Her short stride lengths continue to indicate she has LE weakness and is at risk for falling.                     Cognition Arousal/Alertness: Awake/alert Behavior During Therapy: WFL for tasks assessed/performed Overall Cognitive Status: Within Functional Limits for tasks assessed                      Exercises      General Comments        Pertinent Vitals/Pain Pain Assessment:  (Patient cleared by RN for treatment, patient agrees though she states she has dry mouth, abdominal pain, and reflux. )    Home Living                      Prior Function            PT Goals (current goals can now be found in the care plan section) Acute Rehab PT Goals Patient Stated Goal: "I want to go home" PT Goal Formulation: With patient/family Time For Goal Achievement: 11/01/14 Potential to Achieve Goals: Fair Progress towards PT goals: Progressing toward goals    Frequency  Min 2X/week    PT Plan Current plan remains appropriate    Co-evaluation             End of Session Equipment Utilized During Treatment: Gait belt Activity Tolerance: Patient  tolerated treatment well;Patient limited by fatigue Patient left: in bed;with call bell/phone within reach;with bed alarm set     Time: 1520-1535 PT Time Calculation (min) (ACUTE ONLY): 15 min  Charges:  $Gait Training: 8-22 mins                    G Codes:     Kerman Passey, PT, DPT    10/19/2014, 3:53 PM

## 2014-10-19 NOTE — Plan of Care (Signed)
Problem: Discharge Progression Outcomes Goal: Other Discharge Outcomes/Goals Plan of care progress to goal: - Pain control via PRN pain medication. - No nausea or vomiting this shift. - Assist to [facility]. - Continues IV fluids. Will continue to monitor.

## 2014-10-19 NOTE — Clinical Social Work Note (Signed)
PT is recommending home health PT.  No CSW needs at this time.    [place], [place]

## 2014-10-20 DIAGNOSIS — C50911 Malignant neoplasm of unspecified site of right female breast: Secondary | ICD-10-CM

## 2014-10-20 DIAGNOSIS — R0600 Dyspnea, unspecified: Secondary | ICD-10-CM

## 2014-10-20 DIAGNOSIS — R627 Adult failure to thrive: Secondary | ICD-10-CM

## 2014-10-20 LAB — COMPREHENSIVE METABOLIC PANEL
ALK PHOS: 1129 U/L — AB (ref 38–126)
ALT: 107 U/L — AB (ref 14–54)
AST: 346 U/L — AB (ref 15–41)
Albumin: 1.7 g/dL — ABNORMAL LOW (ref 3.5–5.0)
Anion gap: 23 — ABNORMAL HIGH (ref 5–15)
BILIRUBIN TOTAL: 5.8 mg/dL — AB (ref 0.3–1.2)
BUN: 21 mg/dL — ABNORMAL HIGH (ref 6–20)
CALCIUM: 7.2 mg/dL — AB (ref 8.9–10.3)
CHLORIDE: 89 mmol/L — AB (ref 101–111)
CO2: 16 mmol/L — ABNORMAL LOW (ref 22–32)
CREATININE: 0.6 mg/dL (ref 0.44–1.00)
GFR calc Af Amer: >60 mL/min (ref >60)
Glucose, Bld: 55 mg/dL — ABNORMAL LOW (ref 65–99)
Potassium: 4.7 mmol/L (ref 3.5–5.1)
Sodium: 128 mmol/L — ABNORMAL LOW (ref 135–145)
Total Protein: 5.1 g/dL — ABNORMAL LOW (ref 6.5–8.1)

## 2014-10-20 LAB — CBC
HCT: 23.7 % — ABNORMAL LOW (ref 35.0–47.0)
Hemoglobin: 7.5 g/dL — ABNORMAL LOW (ref 12.0–16.0)
MCH: 31.1 pg (ref 26.0–34.0)
MCHC: 31.7 g/dL — ABNORMAL LOW (ref 32.0–36.0)
MCV: 98.1 fL (ref 80.0–100.0)
PLATELETS: 222 10*3/uL (ref 150–440)
RBC: 2.42 MIL/uL — ABNORMAL LOW (ref 3.80–5.20)
RDW: 20 % — AB (ref 11.5–14.5)
WBC: 4.1 10*3/uL (ref 3.6–11.0)

## 2014-10-20 LAB — PREPARE RBC (CROSSMATCH)

## 2014-10-20 LAB — ABO/RH: ABO/RH(D): A POS

## 2014-10-20 MED ORDER — FENTANYL 12 MCG/HR TD PT72: 12.5000 ug | MEDICATED_PATCH | TRANSDERMAL | Status: DC

## 2014-10-20 MED ORDER — HYDROCODONE-ACETAMINOPHEN 10-325 MG PO TABS: 1.0000 | ORAL_TABLET | ORAL | Status: DC | PRN

## 2014-10-20 MED ORDER — FUROSEMIDE 40 MG PO TABS: 40.0000 mg | ORAL_TABLET | Freq: Every day | ORAL | Status: AC

## 2014-10-20 MED ORDER — ALBUTEROL SULFATE HFA 108 (90 BASE) MCG/ACT IN AERS: 2.0000 | INHALATION_SPRAY | RESPIRATORY_TRACT | Status: AC | PRN

## 2014-10-20 MED ORDER — SPIRONOLACTONE 25 MG PO TABS: 25.0000 mg | ORAL_TABLET | Freq: Two times a day (BID) | ORAL | Status: AC

## 2014-10-20 MED ORDER — METOCLOPRAMIDE HCL 5 MG PO TABS: 10.0000 mg | ORAL_TABLET | Freq: Four times a day (QID) | ORAL | Status: DC | PRN

## 2014-10-20 MED ORDER — ACETAMINOPHEN 325 MG PO TABS
650.0000 mg | ORAL_TABLET | Freq: Once | ORAL | Status: AC
  Administered 2014-10-20: 19:00:00 650 mg via ORAL
  Filled 2014-10-20: qty 2

## 2014-10-20 MED ORDER — SPIRONOLACTONE 25 MG PO TABS
25.0000 mg | ORAL_TABLET | Freq: Two times a day (BID) | ORAL | Status: DC
  Administered 2014-10-20 – 2014-10-21 (×2): 25 mg via ORAL
  Filled 2014-10-20 (×2): qty 1

## 2014-10-20 MED ORDER — FENTANYL 12 MCG/HR TD PT72
12.5000 ug | MEDICATED_PATCH | TRANSDERMAL | Status: DC
  Filled 2014-10-20: qty 1

## 2014-10-20 MED ORDER — DIPHENHYDRAMINE HCL 25 MG PO CAPS
25.0000 mg | ORAL_CAPSULE | Freq: Once | ORAL | Status: AC
  Administered 2014-10-20: 25 mg via ORAL
  Filled 2014-10-20: qty 1

## 2014-10-20 MED ORDER — FENTANYL 12 MCG/HR TD PT72
12.5000 ug | MEDICATED_PATCH | TRANSDERMAL | Status: DC
  Administered 2014-10-20: 12.5 ug via TRANSDERMAL

## 2014-10-20 MED ORDER — IPRATROPIUM-ALBUTEROL 0.5-2.5 (3) MG/3ML IN SOLN: 3.0000 mL | RESPIRATORY_TRACT | Status: DC | PRN

## 2014-10-20 MED ORDER — FENTANYL 25 MCG/HR TD PT72: 25.0000 ug | MEDICATED_PATCH | TRANSDERMAL | Status: DC

## 2014-10-20 MED ORDER — SODIUM CHLORIDE 0.9 % IV SOLN
Freq: Once | INTRAVENOUS | Status: AC
  Administered 2014-10-20: 17:00:00 via INTRAVENOUS

## 2014-10-20 MED ORDER — PREDNISONE 20 MG PO TABS
40.0000 mg | ORAL_TABLET | Freq: Every day | ORAL | Status: DC
Start: 2014-10-20 — End: 2014-10-21
  Administered 2014-10-20 – 2014-10-21 (×2): 40 mg via ORAL
  Filled 2014-10-20 (×2): qty 2

## 2014-10-20 MED ORDER — POTASSIUM CHLORIDE ER 20 MEQ PO TBCR: 10.0000 meq | EXTENDED_RELEASE_TABLET | Freq: Every day | ORAL | Status: DC

## 2014-10-20 MED ORDER — METOCLOPRAMIDE HCL 10 MG PO TABS: 10.0000 mg | ORAL_TABLET | Freq: Four times a day (QID) | ORAL | Status: AC | PRN

## 2014-10-20 MED ORDER — HYDROCODONE-ACETAMINOPHEN 10-325 MG PO TABS
1.0000 | ORAL_TABLET | ORAL | Status: DC | PRN
  Administered 2014-10-20 (×2): 1 via ORAL
  Administered 2014-10-21: 15:00:00 2 via ORAL
  Administered 2014-10-21: 07:00:00 1 via ORAL
  Administered 2014-10-21: 11:00:00 2 via ORAL
  Filled 2014-10-20: qty 2
  Filled 2014-10-20 (×2): qty 1
  Filled 2014-10-20: qty 2
  Filled 2014-10-20: qty 1

## 2014-10-20 NOTE — Progress Notes (Signed)
PT Cancellation Note  Patient Details Name: Jenna Pitts MRN: [id number] DOB: 03-31-1950   Cancelled Treatment:    Reason Eval/Treat Not Completed: Medical issues which prohibited therapy Spoke with nursing who states that pt will be getting a transfusion shortly and given the hospice news PT would likely not be a top priority today.  Will hold PT for today.  Kreg Shropshire 10/20/2014, 3:46 PM

## 2014-10-20 NOTE — Consult Note (Signed)
ONCOLOGY followup note -   HPI: still weak, gets very dyspneic at rest and on trying to ambulate. Leg swerlling same. ROS: Denies fever. Denies bleeding symptoms. Still gets frequent pain but does not want to try Dilaudid or stronger opioid than hydrocodone. Exam: Weak, dyspneic on speaking. Icteric. Pallor present.             Vital signs - 98.4, 115, 20, 125/77             Lungs - decreased breath sounds at bases, more so on the right side             Abdomen - mildly tender right upper quadrant, bowel sounds present             Extremities - bilateral vertebral plus pitting edema. Labs - hemoglobin 7.5, WBC 4.1, platelets 222, creatinine 0.6, bilirubin 5.8 (was 7.5), AST 346 (was 243), ALT 107 (was 98), albumin             1.7, alkaline phosphatase 1129.  Impression / Recommendations: 64 year old female patient with known history of widely metastatic right breast cancer, extensive liver metastasis, lung and bone involvement who recently started on palliative chemotherapy with Herceptin/Perjeta/Navelbine. Patient is admitted with progressive weakness, failure to thrive, progressive jaundice and liver functions likely indicative of progressive hepatic insufficiency from widespread metastatic cancer. Patient continues to have remarkable dyspnea, we will add oxygen and if no improvement consider one unit packed red blood cell supportive transfusion to see if symptoms improved. Also will add second diabetic Aldactone 25 mg by mouth twice a day to ongoing Lasix. Will add prednisone 40 mg for failure to thrive and also see if it will help with pain control. We will change Norco to 10/325 mg strength since patient does not want to take stronger opioid pain medication. Have also discussed with patient and family present (husband and children are at bedside) regarding overall extremely poor prognosis and given declining overall condition recommended pursuing hospice/palliative care, she is agreeable and will  make referral for home hospice. Patient does want to come for follow-[facility] at [facility] next week in the small chance she is doing better might consider resuming chemotherapy but at this time understands that this is very small possibility, we will make appointment to see her on August 30 with labs. Patient is DO NOT RESUSCITATE. May discharge home tomorrow if transfusion is done and hospice has been arranged.

## 2014-10-20 NOTE — Progress Notes (Signed)
[facility] [place] NAME: Kaarin Pardy    MR#:  [id number]  DATE OF BIRTH:  19-Oct-1950  SUBJECTIVE:  CHIEF COMPLAINT:   Chief Complaint  Patient presents with  . Weakness   Has abdominal pain. Nausea improved. SOB and cough.  REVIEW OF SYSTEMS:    Review of Systems  Constitutional: Positive for malaise/fatigue. Negative for fever and chills.  HENT: Negative for sore throat.   Eyes: Negative for blurred vision, double vision and pain.  Respiratory: Negative for cough, hemoptysis, shortness of breath and wheezing.   Cardiovascular: Negative for chest pain, palpitations, orthopnea and leg swelling.  Gastrointestinal: Positive for heartburn, nausea, vomiting, abdominal pain and constipation. Negative for diarrhea.  Genitourinary: Negative for dysuria and hematuria.  Musculoskeletal: Positive for back pain. Negative for joint pain.  Skin: Negative for rash.  Neurological: Positive for dizziness and weakness. Negative for sensory change, speech change, focal weakness and headaches.  Endo/Heme/Allergies: Does not bruise/bleed easily.  Psychiatric/Behavioral: Negative for depression. The patient has insomnia. The patient is not nervous/anxious.       DRUG ALLERGIES:   Allergies  Allergen Reactions  . Aspirin Other (See Comments)    Reaction:  Ringing ears   . Perjeta [Pertuzumab] Other (See Comments) and Hypertension    Pt developed chills and hypertension towards end of tx, Dr Grayland Ormond stopped tx and medicated with Benadyrl and Solucortef.    . Band-Aid Plus Antibiotic [Bacitracin-Polymyxin B] Rash and Other (See Comments)    Pt states this only happens when applied to her chest area.    . Pseudoephedrine Palpitations    VITALS:  Blood pressure 125/77, pulse 115, temperature 98.4 F (36.9 C), temperature source Oral, resp. rate 20, height 5\' 6"  (1.676 m), weight 81.738 kg (180 lb 3.2 oz), SpO2 100 %.  PHYSICAL  EXAMINATION:   Physical Exam  GENERAL:  64 y.o.-year-old patient lying in the bed with no acute distress. Jaundice EYES: Pupils equal, round, reactive to light and accommodation. . Extraocular muscles intact.  HEENT: Head atraumatic, normocephalic. Oropharynx and nasopharynx clear.  NECK:  Supple, no jugular venous distention. No thyroid enlargement, no tenderness.  LUNGS: Basal crackles with decreases BS right. CARDIOVASCULAR: S1, S2 normal. No murmurs, rubs, or gallops.  ABDOMEN: Soft. Bowel sounds present. No organomegaly or mass. Tender. Hepatomegaly EXTREMITIES: No cyanosis, clubbing or edema b/l.    NEUROLOGIC: Cranial nerves II through XII are intact. No focal Motor or sensory deficits b/l.   PSYCHIATRIC: The patient is alert and oriented x 3.  SKIN: No obvious rash, lesion, or ulcer.    LABORATORY PANEL:   CBC  Recent Labs Lab 10/20/14 0606  WBC 4.1  HGB 7.5*  HCT 23.7*  PLT 222   ------------------------------------------------------------------------------------------------------------------  Chemistries   Recent Labs Lab 10/20/14 0606  NA 128*  K 4.7  CL 89*  CO2 16*  GLUCOSE 55*  BUN 21*  CREATININE 0.60  CALCIUM 7.2*  AST 346*  ALT 107*  ALKPHOS 1129*  BILITOT 5.8*   ------------------------------------------------------------------------------------------------------------------  Cardiac Enzymes No results for input(s): TROPONINI in the last 168 hours. ------------------------------------------------------------------------------------------------------------------  RADIOLOGY:  Dg Chest 2 View  10/19/2014   CLINICAL DATA:  Shortness of breath.  EXAM: CHEST  2 VIEW  COMPARISON:  None.  FINDINGS: The heart size and mediastinal contours are within normal limits. Left internal jugular Port-A-Cath is noted with distal tip in expected position of SVC. No pneumothorax is noted. Bilateral pleural effusions are noted with  right greater than left  underlying atelectasis or infiltrate in right lung base cannot be excluded. Mild central pulmonary vascular congestion is noted with possible mild bilateral perihilar edema. The visualized skeletal structures are unremarkable.  IMPRESSION: Mild central pulmonary vascular congestion with possible mild perihilar edema. Bilateral pleural effusions are noted with right greater than left.   Electronically Signed   By: Marijo Conception, M.D.   On: 10/19/2014 10:38     ASSESSMENT AND PLAN:   Arelys Glassco is a 64 y.o. 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  * Pulmonary edema Due to hypoalbuminemia and fluid overload. Recent MUGA had normal EF. On IV lasix. Also has bilateral pleural effusions.  # Generalized weakness-dehydration and also from recent chemotherapy and worsening of malignancy.  # Pancytopenia Due to malignancy and chemotherapy. Will transfuse 1 unit PRBC as per Dr. Ma Hillock. To see if this helps patients fatigue and SOB.  # chronic back pain-will need significant pain control. Increase fentanyl patch and Norco  # worsening LFTs- due to liver metastases. Also jaundiced.  # metastatic breast cancer-metastases to liver, lung, bone. Refractory to prior chemotherapy and started on palliative chemotherapy at this time. Poor prognosis Hospice at home has been set up. Also home O2 being set up.  # DVT prophylaxis heparin  Patient understands poor prognosis. Hospice has been set up. Plan was to discharge patient home with hospice. But cancelled for blood transfusion as per Dr. Beverly Gust discussion with patient. Prescriptions printed  All the records are reviewed and case discussed with Care Management/Social  Workerr. Management plans discussed with the patient, family and they are in agreement.  CODE STATUS: DNR  TOTAL TIME TAKING CARE OF THIS PATIENT: 40 minutes.    D/C TOMORROW. Home with hospice   [name] R M.D on 10/20/2014 at 4:01 PM  Between 7am to 6pm - Pager - [phone]  After 6pm go to [email or web address] - password [email or web address]  [facility]  Office  [phone]  CC: Primary care physician; Ria Bush, MD

## 2014-10-20 NOTE — Discharge Instructions (Addendum)
°  DIET:  Regular diet  DISCHARGE CONDITION:  Fair  ACTIVITY:  Activity as tolerated  OXYGEN:  Home Oxygen: No.   Oxygen Delivery: room air  DISCHARGE LOCATION:  home with hospice

## 2014-10-20 NOTE — Care Management (Signed)
Chaplain notified of patient's wishes to complete advanced directive.

## 2014-10-20 NOTE — Care Management (Signed)
Met with patient and her family regarding hospice agency. Patient states she wants to go home. O2 is new. All agree that they would like to use [facility] at home. They all agree that patient would need to go by EMS to home. I have completed packet for EMS. Santiago Glad with [facility] hospice aware that patient plans to discharge to home today and will follow up with patient. In the mean time, Santiago Glad is working on arranging home O2. The plan per Santiago Glad is that when O2 has been set up in the home a family member will call 1C to let them know it is OKAY to call EMS but not until O2 has been set up in the home. No further RNCM needs. Case closed.

## 2014-10-20 NOTE — Progress Notes (Signed)
New referral for [facility] after discharge received from [facility] following patient and family conversation with Dr. Ma Hillock. Mrs. 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 She presented to the hospital secondary to worsening weakness over the last 2 weeks. She is currently being treated for pulmonary edema, dehydration and increased pain. She is to receive 1 unit of PRBC's this evening. Her fentanyl patch has been increased from 12.5 mcg to 25 mcg, she is currently using Norco 10-395m 1-2 tabs q 4 hrs for break through pain, this was increased from [facility] today 8/26.  Writer met in the patient room with her husband LFritz Pickerel daughter EJunie Panningand her husband DShanon Brow patient's son and 2 other family members. Education was initiated regarding hospice services, philosophy and team approach to care with good understanding voiced. Patient was drowsy through out the visit, she appeared pale, weak and slightly dyspneic. Oxygen in place at 2 liters via nasal cannula.  DME needs include a hospital bed and oxygen, patient currently has in her home a walker and BSC. Delivery of equipment requested for this evening, LFritz Pickerelto be contact for equipment delivery at [phone] Original discharge was planned for today, this will be delayed d/t transfusion.  Patient and family both expressed their desire for patient to remain in the hospital overnight with planned discharge tomorrow via EMS. Of note patient needs a signed portable DNR to accompany her home.  Please fax final discharge summary when complete to hospice at [phone] Contact hospice at [phone] any questions/patient needs prior to discharge. Thank you for the opportunity to participate  in the care of Mrs. Indelicato. KFlo ShanksRN, BSN, [facility]

## 2014-10-20 NOTE — Plan of Care (Signed)
Problem: Discharge Progression Outcomes Goal: Other Discharge Outcomes/Goals Plan of care progress to goal: - Pain control via PRN pain medication. - No nausea or vomiting this shift. - Assist to [facility]. - Continues IV fluids. Will continue to monitor.

## 2014-10-20 NOTE — Progress Notes (Signed)
Met with patient and family.  Went over Lexicographer with patient. Jenna Pitts, Jenna Pitts

## 2014-10-20 NOTE — Plan of Care (Signed)
Problem: Discharge Progression Outcomes Goal: Other Discharge Outcomes/Goals Outcome: Progressing Patient c/o right ABD and back pain relieved well with prn pain meds Patients  25 mcg Fentanyl  Patch changed today and 12.5 mcg patch added One unit of blood ordered ready at shift change to will be given tonight  Nausea and heartburn relieved well with prn Zofran and Pepcid  Patient is to discharge tomorrow home with Hospice

## 2014-10-21 LAB — TYPE AND SCREEN
ABO/RH(D): A POS
ANTIBODY SCREEN: NEGATIVE
Unit division: 0

## 2014-10-21 MED ORDER — FENTANYL 12 MCG/HR TD PT72: 12.5000 ug | MEDICATED_PATCH | TRANSDERMAL | Status: AC

## 2014-10-21 MED ORDER — PREDNISONE 20 MG PO TABS: 40.0000 mg | ORAL_TABLET | Freq: Every day | ORAL | Status: AC

## 2014-10-21 MED ORDER — FENTANYL 25 MCG/HR TD PT72: 25.0000 ug | MEDICATED_PATCH | TRANSDERMAL | Status: AC

## 2014-10-21 MED ORDER — HYDROCODONE-ACETAMINOPHEN 10-325 MG PO TABS: 1.0000 | ORAL_TABLET | ORAL | Status: AC | PRN

## 2014-10-21 MED ORDER — POTASSIUM CHLORIDE ER 20 MEQ PO TBCR: 10.0000 meq | EXTENDED_RELEASE_TABLET | Freq: Every day | ORAL | Status: AC

## 2014-10-21 NOTE — Care Management Note (Signed)
Case Management Note  Patient Details  Name: Jenna Pitts MRN: [id number] Date of Birth: 02/11/1951  Subjective/Objective:    Call to [place] [facility] [place] that Ms Ruda is discharging home today. Hospice has already set up home oxygen and Ms Pumphrey will be transported home via EMS.                 Action/Plan:   Expected Discharge Date:                  Expected Discharge Plan:     In-House Referral:     Discharge planning Services     Post Acute Care Choice:    Choice offered to:     DME Arranged:    DME Agency:     HH Arranged:    [place] Agency:     Status of Service:     Medicare Important Message Given:    Date Medicare IM Given:    Medicare IM give by:    Date Additional Medicare IM Given:    Additional Medicare Important Message give by:     If discussed at [place] of Stay Meetings, dates discussed:    Additional Comments:  Todrick Siedschlag A, RN 10/21/2014, 2:32 PM

## 2014-10-21 NOTE — Discharge Summary (Signed)
[facility] NAME: Jenna Pitts    MR#:  [id number]  DATE OF BIRTH:  25-Oct-1950  DATE OF ADMISSION:  10/17/2014 ADMITTING PHYSICIAN: Gladstone Lighter, MD  DATE OF DISCHARGE: 10/21/2014  4:16 PM  PRIMARY CARE PHYSICIAN: Ria Bush, MD    ADMISSION DIAGNOSIS:  Hyperbilirubinemia [E80.6] Leukopenia [D72.819] Weakness [R53.1] Tachycardia [R00.0] Metastatic disease [C80.1] Breast cancer, unspecified laterality [C50.919]  DISCHARGE DIAGNOSIS:  Active Problems:   Hyponatremia   SECONDARY DIAGNOSIS:   Past Medical History  Diagnosis Date  . Blood transfusion without reported diagnosis [place]  . GERD (gastroesophageal reflux disease)   . Staph infection 1986    AFTER C/S 39 YEARS AGO  . Headache     used to have migraines but no longer  . Breast cancer     metastasized to liver, lung and bone  . Anemia     blood transfusion 40 years ago    HOSPITAL COURSE:   Jenna Pitts is a 64 y.o. 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  * Pulmonary edema Due to hypoalbuminemia and fluid overload. Recent MUGA had normal EF. On Lasix and Aldactone. Also has bilateral pleural effusions.  # Generalized weakness-dehydration and also from recent chemotherapy and worsening of malignancy.  # Pancytopenia Due to malignancy and chemotherapy. Cigarette 1 unit of packed RBC per oncology prior to discharge.  # chronic back due to her metastatic disease. Increased fentanyl patch, currently on 37.5 g and also on Norco for breakthrough pain  # worsening LFTs- due to liver metastases. Also jaundiced. Worsening jaundice noted prior to  discharge.  # metastatic breast cancer-metastases to liver, lung, bone. Refractory to prior chemotherapy and started on palliative chemotherapy at this time. Poor prognosis Hospice at home has been set up. Also home O2 being set up.  Patient understands poor prognosis. Family aware. Patient will be discharged to home with hospice services today   DISCHARGE CONDITIONS:   Guarded with extremely poor prognosis  CONSULTS OBTAINED:    oncology consultation by Dr. Ma Hillock  DRUG ALLERGIES:   Allergies  Allergen Reactions  . Aspirin Other (See Comments)    Reaction:  Ringing ears   . Perjeta [Pertuzumab] Other (See Comments) and Hypertension    Pt developed chills and hypertension towards end of tx, Dr Grayland Ormond stopped tx and medicated with Benadyrl and Solucortef.    . Band-Aid Plus Antibiotic [Bacitracin-Polymyxin B] Rash and Other (See Comments)    Pt states this only happens when applied to her chest area.    . Pseudoephedrine Palpitations    DISCHARGE MEDICATIONS:   Discharge Medication List as of 10/21/2014  2:26 PM    START taking these medications   Details  albuterol (PROVENTIL HFA;VENTOLIN HFA) 108 (90 BASE) MCG/ACT inhaler Inhale 2 puffs into the lungs every 4 (four) hours as needed for wheezing or shortness of breath., Starting 10/20/2014, Until Discontinued, Print    furosemide (LASIX) 40 MG tablet Take 1 tablet (40 mg total) by mouth daily., Starting 10/20/2014, Until Discontinued, Print    metoCLOPramide (REGLAN) 10 MG tablet Take 1 tablet (10 mg total) by mouth every 6 (six) hours as needed for nausea., Starting 10/20/2014, Until Discontinued, Print  predniSONE (DELTASONE) 20 MG tablet Take 2 tablets (40 mg total) by mouth daily with breakfast., Starting 10/21/2014, Until Discontinued, Print    spironolactone (ALDACTONE) 25 MG tablet Take 1 tablet (25 mg total) by mouth 2 (two) times daily., Starting 10/20/2014, Until Discontinued, Print      CONTINUE these  medications which have CHANGED   Details  fentaNYL (DURAGESIC) 12 MCG/HR Place 1 patch (12.5 mcg total) onto the skin every 3 (three) days., Starting 10/21/2014, Until Discontinued, Print    fentaNYL (DURAGESIC) 25 MCG/HR patch Place 1 patch (25 mcg total) onto the skin every 3 (three) days., Starting 10/21/2014, Until Discontinued, Print    HYDROcodone-acetaminophen (NORCO) 10-325 MG per tablet Take 1-2 tablets by mouth every 4 (four) hours as needed for moderate pain or severe pain (1 - 2 tablets every 4 hours as needed for moderate or severe pain)., Starting 10/21/2014, Until Discontinued, Print    Potassium Chloride ER 20 MEQ TBCR Take 10 mEq by mouth daily., Starting 10/21/2014, Until Discontinued, Print      CONTINUE these medications which have NOT CHANGED   Details  ondansetron (ZOFRAN ODT) 4 MG disintegrating tablet Take 1 tablet (4 mg total) by mouth every 4 (four) hours as needed for nausea or vomiting., Starting 10/10/2014, Until Discontinued, Normal    Lactulose 20 GM/30ML SOLN Take 30 mLs (20 g total) by mouth 3 (three) times daily as needed., Starting 10/16/2014, Until Discontinued, Normal      STOP taking these medications     HYDROcodone-acetaminophen (NORCO/VICODIN) 5-325 MG per tablet      morphine (ROXANOL) 20 MG/ML concentrated solution          DISCHARGE INSTRUCTIONS:    1. To be followed by hospice services 2. Follow-up with oncology in 1 week  If you experience worsening of your admission symptoms, develop shortness of breath, life threatening emergency, suicidal or homicidal thoughts you must seek medical attention immediately by calling 911 or calling your MD immediately  if symptoms less severe.  You Must read complete instructions/literature along with all the possible adverse reactions/side effects for all the Medicines you take and that have been prescribed to you. Take any new Medicines after you have completely understood and accept all the possible  adverse reactions/side effects.   Please note  You were cared for by a hospitalist during your hospital stay. If you have any questions about your discharge medications or the care you received while you were in the hospital after you are discharged, you can call the unit and asked to speak with the hospitalist on call if the hospitalist that took care of you is not available. Once you are discharged, your primary care physician will handle any further medical issues. Please note that NO REFILLS for any discharge medications will be authorized once you are discharged, as it is imperative that you return to your primary care physician (or establish a relationship with a primary care physician if you do not have one) for your aftercare needs so that they can reassess your need for medications and monitor your lab values.    Today   CHIEF COMPLAINT:   Chief Complaint  Patient presents with  . Weakness    VITAL SIGNS:  Blood pressure 113/66, pulse 106, temperature 98.1 F (36.7 C), temperature source Oral, resp. rate 20, height 5\' 6"  (1.676 m), weight 81.738 kg (180 lb 3.2 oz), SpO2 93 %.  I/O:   Intake/Output Summary (Last 24 hours) at 10/21/14 1738 Last data filed  at 10/21/14 1500  Gross per 24 hour  Intake    703 ml  Output    900 ml  Net   -197 ml    PHYSICAL EXAMINATION:   Physical Exam  GENERAL: 64 y.o.-year-old patient lying in the bed with no acute distress. Appears lethargic, easily arousable. Significantly jaundiced EYES: Pupils equal, round, reactive to light and accommodation. . Extraocular muscles intact. Icteric sclera present  HEENT: Head atraumatic, normocephalic. Oropharynx and nasopharynx clear.  NECK: Supple, no jugular venous distention. No thyroid enlargement, no tenderness.  LUNGS: Basal crackles with decreases BS right. Noted to use her accessory muscles for breathing. No wheezing or rhonchi noted CARDIOVASCULAR: S1, S2 normal. No murmurs, rubs, or  gallops.  ABDOMEN: Soft. Bowel sounds present. No organomegaly or mass. Tender. Hepatomegaly EXTREMITIES: No cyanosis, clubbing. Has 2+ bilateral pedal edema  NEUROLOGIC: Cranial nerves II through XII are intact. No focal Motor or sensory deficits b/l.  PSYCHIATRIC: The patient is arousable and oriented x 3.  SKIN: No obvious rash, lesion, or ulcer.   DATA REVIEW:   CBC  Recent Labs Lab 10/20/14 0606  WBC 4.1  HGB 7.5*  HCT 23.7*  PLT 222    Chemistries   Recent Labs Lab 10/20/14 0606  NA 128*  K 4.7  CL 89*  CO2 16*  GLUCOSE 55*  BUN 21*  CREATININE 0.60  CALCIUM 7.2*  AST 346*  ALT 107*  ALKPHOS 1129*  BILITOT 5.8*    Cardiac Enzymes No results for input(s): TROPONINI in the last 168 hours.  Microbiology Results  Results for orders placed or performed during the hospital encounter of 10/17/14  Culture, blood (routine x 2)     Status: None (Preliminary result)   Collection Time: 10/17/14  1:44 PM  Result Value Ref Range Status   Specimen Description BLOOD LEFT PORTA CATH  Final   Special Requests   Final    BOTTLES DRAWN AEROBIC AND ANAEROBIC  1 CC ANAEROBIC 2 CC AEROBIC   Culture NO GROWTH 4 DAYS  Final   Report Status PENDING  Incomplete  Culture, blood (routine x 2)     Status: None (Preliminary result)   Collection Time: 10/17/14  2:12 PM  Result Value Ref Range Status   Specimen Description BLOOD LEFT HAND  Final   Special Requests BOTTLES DRAWN AEROBIC AND ANAEROBIC 1CC  Final   Culture NO GROWTH 4 DAYS  Final   Report Status PENDING  Incomplete    RADIOLOGY:  No results found.  EKG:   Orders placed or performed during the hospital encounter of 10/17/14  . ED EKG  . ED EKG      Management plans discussed with the patient, family and they are in agreement.  CODE STATUS:     Code Status Orders        Start     Ordered   10/17/14 7939  Do not attempt resuscitation (DNR)   Continuous    Question Answer Comment  In the event  of cardiac or respiratory ARREST Do not call a "code blue"   In the event of cardiac or respiratory ARREST Do not perform Intubation, CPR, defibrillation or ACLS   In the event of cardiac or respiratory ARREST Use medication by any route, position, wound care, and other measures to relive pain and suffering. May use oxygen, suction and manual treatment of airway obstruction as needed for comfort.      10/17/14 1743      TOTAL TIME  TAKING CARE OF THIS PATIENT: 36 minutes.    Gladstone Lighter M.D on 10/21/2014 at 5:38 PM  Between 7am to 6pm - Pager - [phone]  After 6pm go to [email or web address] - password [email or web address] [facility]  [facility]  Office  [phone]  CC: Primary care physician; Ria Bush, MD

## 2014-10-21 NOTE — Progress Notes (Signed)
Plan of care progress to goal: Pain: med given x2 with improvement, Pepsid given for heartburn x1 with improvement  Hemodynamically: VSS, 1 unit of pRBCs given on 8/26 Diet: tolerating Activity: one assist up to bsc Pt discharged to home with hospice today via EMS, discharge instructions and prescriptions review with Patient and husband and husband verbalized understanding

## 2014-10-21 NOTE — Plan of Care (Signed)
Problem: Discharge Progression Outcomes Goal: Other Discharge Outcomes/Goals Plan of care progress to goal: Pain: med given x1 with improvement Hemodynamically: VSS, 1 unit of pRBCs given Diet: tolerating Activity: one assist up to [place] discharge home today with hospice

## 2014-10-21 NOTE — Progress Notes (Signed)
   10/21/14 1030  Clinical Encounter Type  Visited With Patient and family together  Visit Type Follow-up  Referral From Chaplain;Nurse  Consult/Referral To Chaplain  Chaplain visited with family to inform them that we were working on finding a notary and witnesses in order for HOP to be completed. This task my not be completed without a notary available before patients discharge.      [place] [phone]

## 2014-10-24 LAB — CULTURE, BLOOD (ROUTINE X 2)
CULTURE: NO GROWTH
CULTURE: NO GROWTH

## 2014-10-26 DEATH — deceased

## 2014-10-27 ENCOUNTER — Other Ambulatory Visit: Payer: BLUE CROSS/BLUE SHIELD

## 2014-10-27 ENCOUNTER — Ambulatory Visit: Payer: BLUE CROSS/BLUE SHIELD | Admitting: Internal Medicine

## 2014-10-27 ENCOUNTER — Ambulatory Visit: Payer: BLUE CROSS/BLUE SHIELD

## 2014-11-03 ENCOUNTER — Ambulatory Visit: Payer: BLUE CROSS/BLUE SHIELD | Admitting: Internal Medicine

## 2014-11-03 ENCOUNTER — Ambulatory Visit: Payer: BLUE CROSS/BLUE SHIELD

## 2015-01-04 ENCOUNTER — Other Ambulatory Visit: Payer: Self-pay | Admitting: Nurse Practitioner

## 2015-01-31 ENCOUNTER — Other Ambulatory Visit: Payer: Self-pay | Admitting: Nurse Practitioner

## 2015-01-31 NOTE — Telephone Encounter (Signed)
Erroneous encounter

## 2016-11-15 IMAGING — CT NM PET TUM IMG RESTAG (PS) SKULL BASE T - THIGH
9 series · 25 of 25 positions shown · non-contrast
Comparison: None.

CLINICAL DATA: Subsequent treatment strategy for right-sided breast
cancer. Liver lesions on recent ultrasound exam.

EXAM:
NUCLEAR MEDICINE PET SKULL BASE TO THIGH
TECHNIQUE: 12.0 mCi F-18 FDG was injected intravenously. Full-ring PET imaging
was performed from the skull base to thigh after the radiotracer. CT
data was obtained and used for attenuation correction and anatomic
localization.
FASTING BLOOD GLUCOSE:  Value: 81 mg/dl

[Series 3: ct wb 5.0 b30f · axial · 5.0mm · 0.98mm/px · z∈[-1428,-562]mm · 4 of 290 slices shown]
[im 1/290]
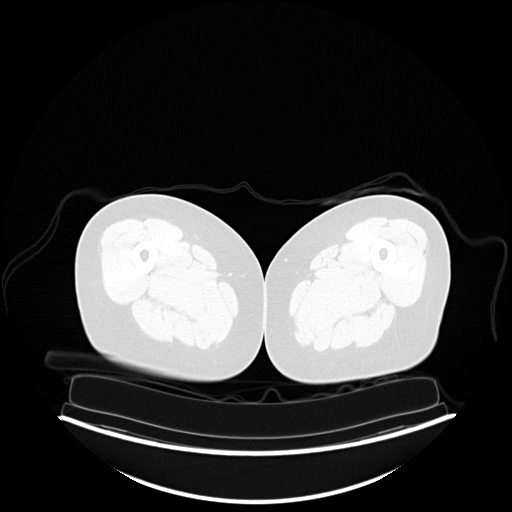
[im 97/290]
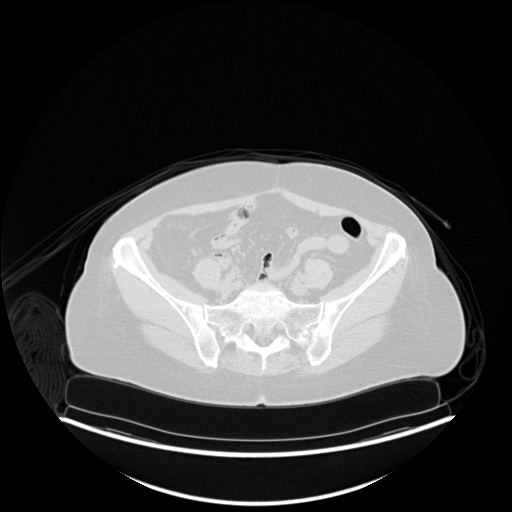
[im 193/290]
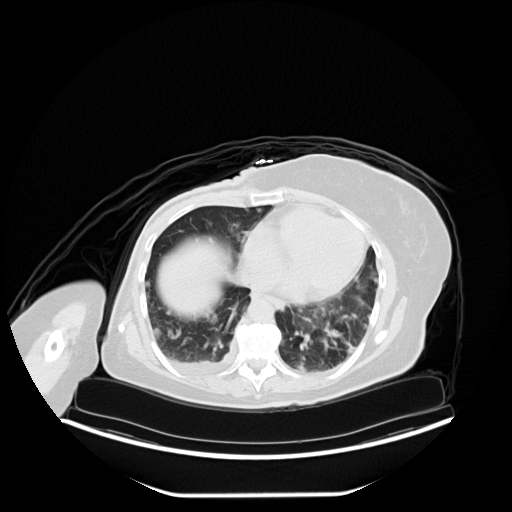
[im 290/290]
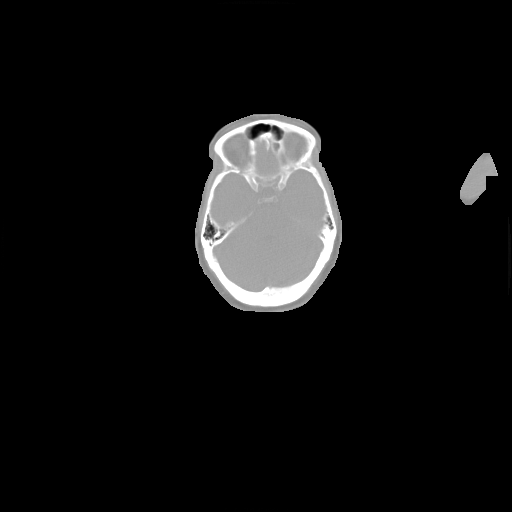

[Series 4: pet wb (ac) · axial · 5.0mm · 4.07mm/px · z∈[-1428,-562]mm · 4 of 290 slices shown]
[im 1/290]
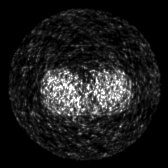
[im 97/290]
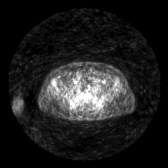
[im 193/290]
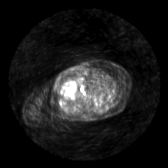
[im 290/290]
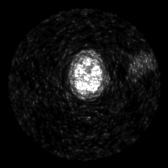

[Series 5: pet wb uncorrected (nac) · axial · 5.0mm · 4.07mm/px · z∈[-1428,-562]mm · 4 of 290 slices shown]
[im 1/290]
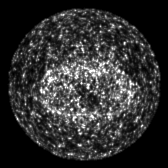
[im 97/290]
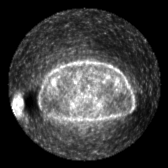
[im 193/290]
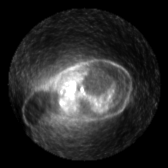
[im 290/290]
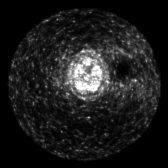

[Series 603: pet axial · 4 of 290 slices shown]
[im 1/290]
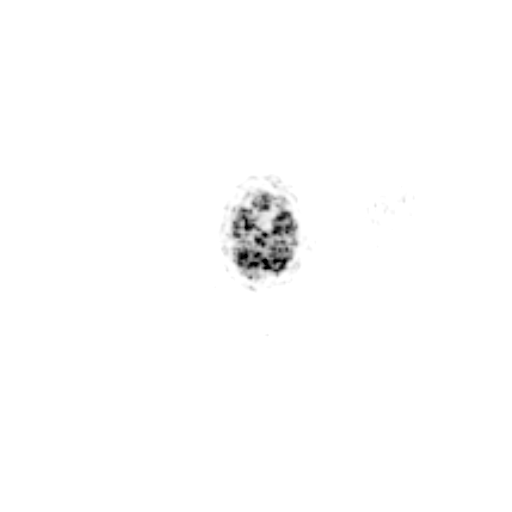
[im 97/290]
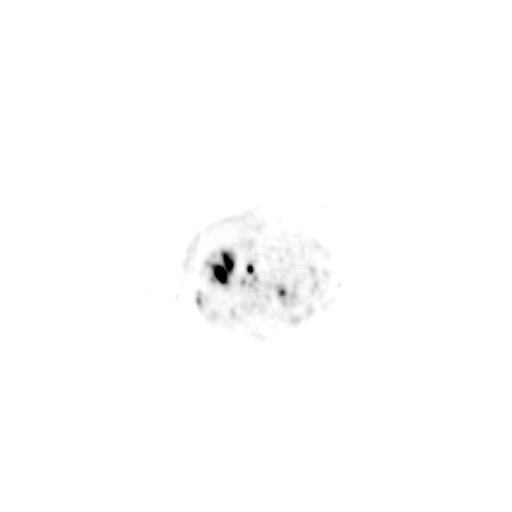
[im 193/290]
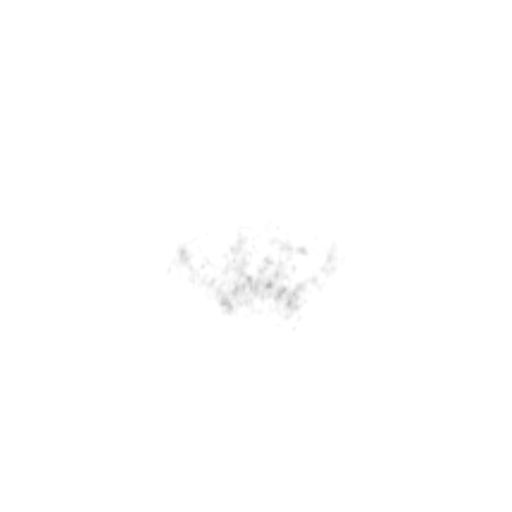
[im 290/290]
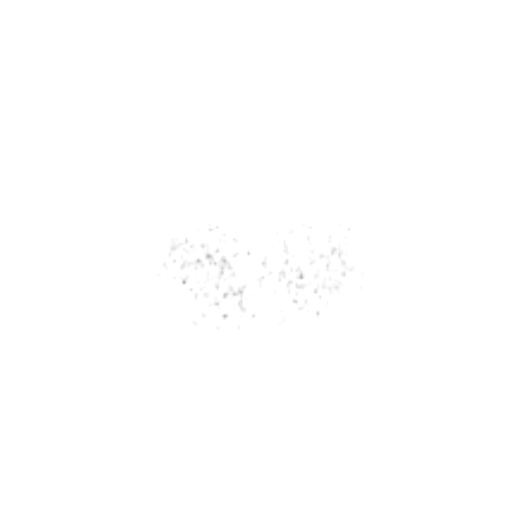

[Series 604: pet coronal · 1 of 82 slices shown]
[im 1/82]
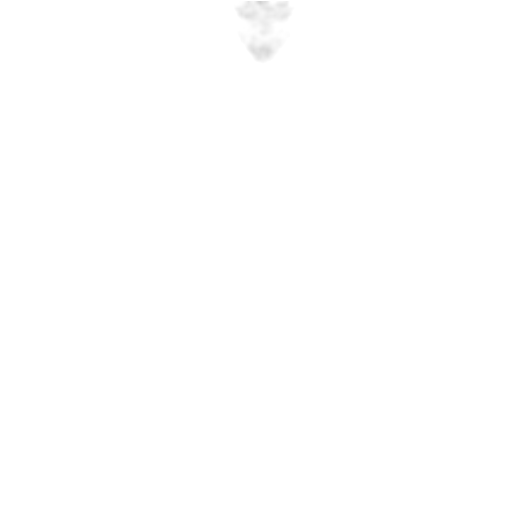

[Series 605: pet sagittal · 1 of 101 slices shown]
[im 1/101]
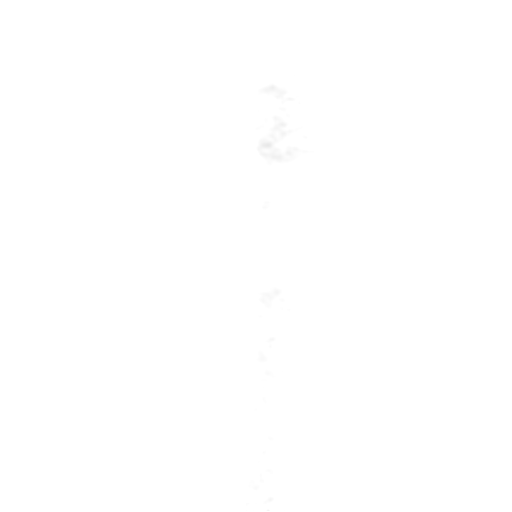

[Series 606: pet/ct axial · 4 of 288 slices shown]
[im 1/288]
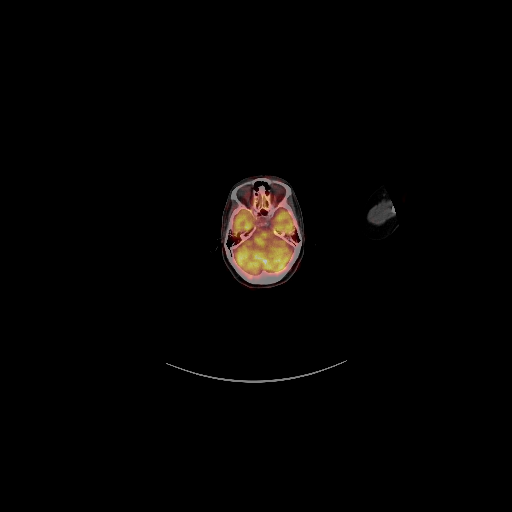
[im 96/288]
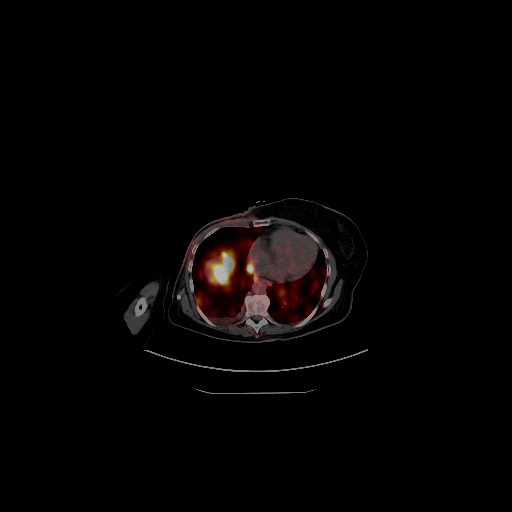
[im 192/288]
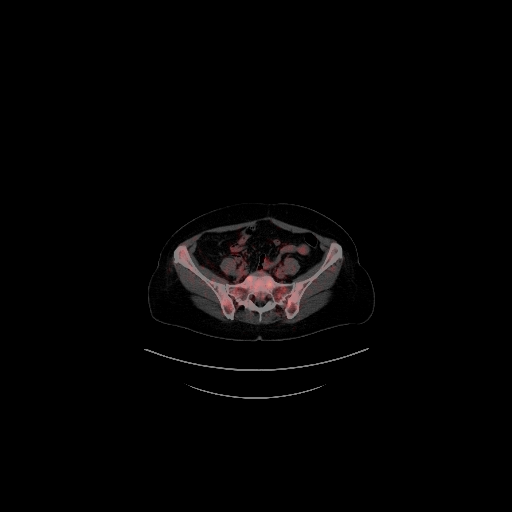
[im 288/288]
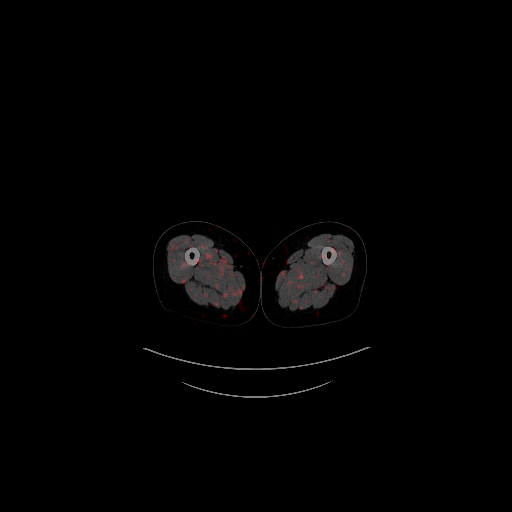

[Series 607: pet/ct coronal · 1 of 85 slices shown]
[im 1/85]
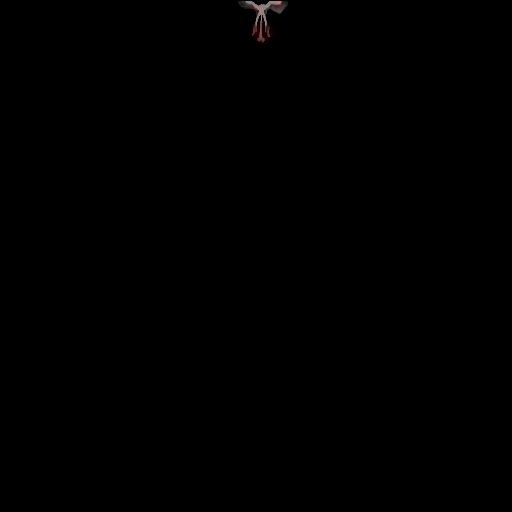

[Series 608: pet/ct sagittal · 2 of 119 slices shown]
[im 1/119]
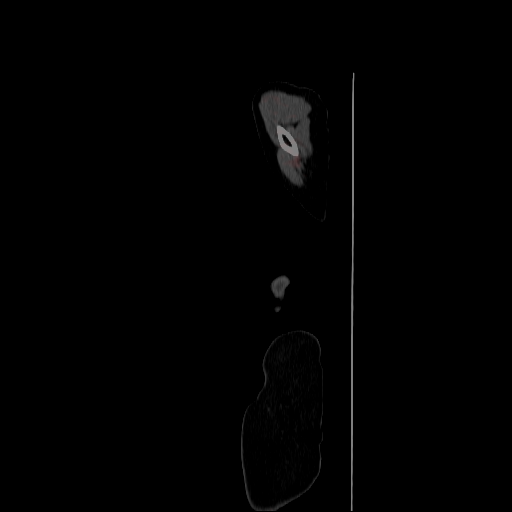
[im 119/119]
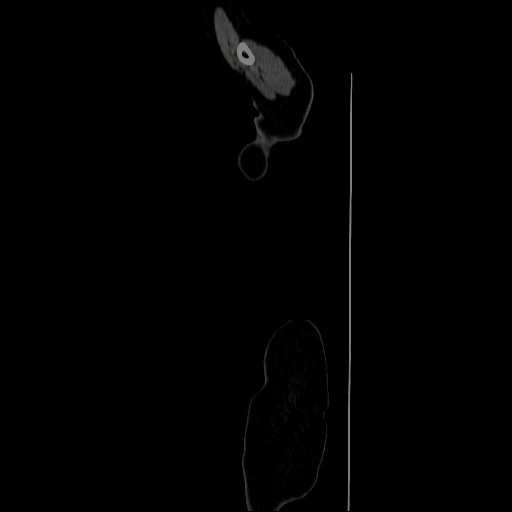

[25 of 25 positions shown; findings below may reference images not displayed]

FINDINGS: NECK

No hypermetabolic lymph nodes in the neck.

CHEST

No evidence for hypermetabolic axillary lymphadenopathy.
Hypermetabolic mediastinal metastases are evident. 8 mm short axis
subcarinal lymph node demonstrates SUV max = 13.2. Left hilar
hypermetabolic lymphadenopathy demonstrates SUV max =
hypermetabolic lymph nodes are seen in the right hilum.

10 mm right lung nodule (image 89 series 3) is hypermetabolic with
SUV max = 7 point formed 16 mm conglomeration of left lower lobe
pulmonary nodules (image 101 series 3) is hypermetabolic with SUV
max = 7.3.

There are tiny innumerable bilateral pulmonary nodules scattered
throughout the lung parenchyma bilaterally.

A trace amount of subcutaneous emphysema is seen in the right
anterior chest wall. The patient is 14 days out from mastectomy
which is at the outer limit for expected postoperative soft tissue
gas, but the patient has a right anterior chest wall drain in place
which may also be contributing. A left-sided Port-A-Cath is
visualized with distal catheter tip positioned at the junction of
the SVC and RA.

ABDOMEN/PELVIS

Bulky metastatic disease in the liver is hypermetabolic with tumor
essentially replacing the posterior right liver. SUV max = 32 for
the posterior right hepatic lobe disease.

High attenuation material in the central gallbladder lumen is
probably sludge.

SKELETON

Scattered hypermetabolic bone lesions are evident. Left L2 pedicle
has a hypermetabolic lesion with SUV max = 17.1. No underlying
correlate is visible on CT images. Posterior L5 lesion is
hypermetabolic with SUV max = 16.1.
IMPRESSION: Hypermetabolic mediastinal and hilar lymphadenopathy.

Tiny bilateral pulmonary metastases. Some of the larger metastases
demonstrate hypermetabolic FDG accumulation on PET imaging.

Bulky hypermetabolic liver metastases.

Hypermetabolic bone metastases within the axial skeleton.
